# Patient Record
Sex: Female | Born: 1970 | Race: Black or African American | Hispanic: No | Marital: Married | Smoking: Never smoker
Health system: Southern US, Community
[De-identification: ages and names within clinical notes are randomized; demographics above are authoritative.]

## PROBLEM LIST (undated history)

## (undated) DIAGNOSIS — I10 Essential (primary) hypertension: Secondary | ICD-10-CM

## (undated) DIAGNOSIS — D649 Anemia, unspecified: Secondary | ICD-10-CM

## (undated) HISTORY — DX: Anemia, unspecified: D64.9

## (undated) HISTORY — DX: Essential (primary) hypertension: I10

---

## 1999-04-15 ENCOUNTER — Other Ambulatory Visit: Admission: RE | Admit: 1999-04-15 | Discharge: 1999-04-15 | Payer: Self-pay | Admitting: Internal Medicine

## 2000-04-06 ENCOUNTER — Other Ambulatory Visit: Admission: RE | Admit: 2000-04-06 | Discharge: 2000-04-06 | Payer: Self-pay | Admitting: Internal Medicine

## 2001-12-05 ENCOUNTER — Emergency Department (HOSPITAL_COMMUNITY): Admission: EM | Admit: 2001-12-05 | Discharge: 2001-12-05 | Payer: Self-pay | Admitting: Emergency Medicine

## 2001-12-05 ENCOUNTER — Encounter: Payer: Self-pay | Admitting: Emergency Medicine

## 2001-12-07 ENCOUNTER — Emergency Department (HOSPITAL_COMMUNITY): Admission: EM | Admit: 2001-12-07 | Discharge: 2001-12-07 | Payer: Self-pay | Admitting: Emergency Medicine

## 2001-12-07 ENCOUNTER — Encounter: Payer: Self-pay | Admitting: Emergency Medicine

## 2002-05-06 ENCOUNTER — Other Ambulatory Visit: Admission: RE | Admit: 2002-05-06 | Discharge: 2002-05-06 | Payer: Self-pay | Admitting: Obstetrics and Gynecology

## 2002-09-19 ENCOUNTER — Inpatient Hospital Stay (HOSPITAL_COMMUNITY): Admission: AD | Admit: 2002-09-19 | Discharge: 2002-09-19 | Payer: Self-pay | Admitting: Gynecology

## 2002-10-03 ENCOUNTER — Inpatient Hospital Stay (HOSPITAL_COMMUNITY): Admission: AD | Admit: 2002-10-03 | Discharge: 2002-10-03 | Payer: Self-pay | Admitting: Obstetrics and Gynecology

## 2002-11-10 ENCOUNTER — Inpatient Hospital Stay (HOSPITAL_COMMUNITY): Admission: AD | Admit: 2002-11-10 | Discharge: 2002-11-12 | Payer: Self-pay | Admitting: Obstetrics and Gynecology

## 2003-05-27 ENCOUNTER — Other Ambulatory Visit: Admission: RE | Admit: 2003-05-27 | Discharge: 2003-05-27 | Payer: Self-pay | Admitting: Obstetrics and Gynecology

## 2004-07-19 ENCOUNTER — Other Ambulatory Visit: Admission: RE | Admit: 2004-07-19 | Discharge: 2004-07-19 | Payer: Self-pay | Admitting: Obstetrics and Gynecology

## 2004-09-30 ENCOUNTER — Emergency Department (HOSPITAL_COMMUNITY): Admission: EM | Admit: 2004-09-30 | Discharge: 2004-09-30 | Payer: Self-pay | Admitting: Emergency Medicine

## 2005-08-16 ENCOUNTER — Other Ambulatory Visit: Admission: RE | Admit: 2005-08-16 | Discharge: 2005-08-16 | Payer: Self-pay | Admitting: Obstetrics and Gynecology

## 2005-09-10 ENCOUNTER — Emergency Department (HOSPITAL_COMMUNITY): Admission: EM | Admit: 2005-09-10 | Discharge: 2005-09-10 | Payer: Self-pay | Admitting: Emergency Medicine

## 2007-08-15 HISTORY — PX: TUBAL LIGATION: SHX77

## 2007-09-17 ENCOUNTER — Other Ambulatory Visit: Admission: RE | Admit: 2007-09-17 | Discharge: 2007-09-17 | Payer: Self-pay | Admitting: Family Medicine

## 2007-12-15 ENCOUNTER — Inpatient Hospital Stay (HOSPITAL_COMMUNITY): Admission: AD | Admit: 2007-12-15 | Discharge: 2007-12-15 | Payer: Self-pay | Admitting: Obstetrics and Gynecology

## 2008-05-27 ENCOUNTER — Inpatient Hospital Stay (HOSPITAL_COMMUNITY): Admission: AD | Admit: 2008-05-27 | Discharge: 2008-05-31 | Payer: Self-pay | Admitting: Obstetrics and Gynecology

## 2008-05-30 ENCOUNTER — Encounter (INDEPENDENT_AMBULATORY_CARE_PROVIDER_SITE_OTHER): Payer: Self-pay | Admitting: Obstetrics and Gynecology

## 2009-01-12 ENCOUNTER — Emergency Department (HOSPITAL_COMMUNITY): Admission: EM | Admit: 2009-01-12 | Discharge: 2009-01-12 | Payer: Self-pay | Admitting: Family Medicine

## 2009-10-06 ENCOUNTER — Encounter: Admission: RE | Admit: 2009-10-06 | Discharge: 2009-10-06 | Payer: Self-pay | Admitting: Emergency Medicine

## 2009-11-01 ENCOUNTER — Other Ambulatory Visit: Admission: RE | Admit: 2009-11-01 | Discharge: 2009-11-01 | Payer: Self-pay | Admitting: Family Medicine

## 2009-12-12 ENCOUNTER — Emergency Department (HOSPITAL_COMMUNITY): Admission: EM | Admit: 2009-12-12 | Discharge: 2009-12-12 | Payer: Self-pay | Admitting: Emergency Medicine

## 2010-12-27 NOTE — Op Note (Signed)
[name], [name]                ACCOUNT NO.:  [id number]   MEDICAL RECORD NO.:  [id number]          PATIENT TYPE:  INP   LOCATION:  9101                          FACILITY:  [facility]   PHYSICIAN:  Hal Morales, M.D.DATE OF BIRTH:  Oct 17, 1970   DATE OF PROCEDURE:  05/30/2008  DATE OF DISCHARGE:                               OPERATIVE REPORT   PREOPERATIVE DIAGNOSIS:  Desire for surgical sterilization.   POSTOPERATIVE DIAGNOSIS:  Desire for surgical sterilization.   OPERATION:  Postpartum tubal ligation.   ANESTHESIA:  Epidural.   ESTIMATED BLOOD LOSS:  Less than 10 mL.   COMPLICATIONS:  None.   FINDINGS:  The tubes appeared normal for the postpartum state.   PROCEDURE:  The patient was taken to the operating room after  appropriate identification and placed on the operating table.  Her labor  epidural was dosed for surgical anesthesia.  The abdomen was prepped  with multiple layers of Betadine and draped as a sterile field.  After  the assurance of adequate anesthesia, a subumbilical injection of 10 mL  of 0.25%  Marcaine was undertaken.  A subumbilical incision was made and  the abdomen opened in layers.  The peritoneum was entered.  The left  fallopian tube was identified, followed to its fimbriated end, then  grasped at the isthmic portion and elevated.  A suture of 2-0 chromic  was placed through the mesosalpinx and tied __________on a knuckle of  tube.  A second ligature was placed proximal to that and the intervening  knuckle of tube was excised.  The cut ends were cauterized.  A similar  procedure was carried out on the opposite side.  Hemostasis was noted to  be adequate.  The abdominal peritoneum was closed in a pursestring  fashion with a suture of 0 Vicryl.  The fascia was closed in a running  fashion with a suture of 0 Vicryl.  The skin incision was closed with a  subcuticular suture of 3-0 Vicryl.  A sterile dressing was applied.  The  patient was taken from the  operating room to the recovery room in  satisfactory condition having tolerated the procedure well with sponge  and instrument counts correct.   SPECIMENS TO PATHOLOGY:  Portions of right and left fallopian tube.      Hal Morales, M.D.  Electronically Signed     VPH/MEDQ  D:  05/30/2008  T:  05/30/2008  Job:  [id number]

## 2010-12-27 NOTE — Discharge Summary (Signed)
NAMEKELLINA, DREESE                ACCOUNT NO.:  [id number]   MEDICAL RECORD NO.:  [id number]          PATIENT TYPE:  INP   LOCATION:  9101                          [facility]ACILITY:  [facility]   PHYSICIAN:  Hal Morales, M.D.DATE OF BIRTH:  Oct 20, 1970   DATE OF ADMISSION:  05/27/2008  DATE OF DISCHARGE:  05/31/2008                               DISCHARGE SUMMARY   ADMITTING DIAGNOSES:  1. Intrauterine pregnancy at 39-1/7 weeks.  2. Pregnancy-induced hypertension versus preeclampsia.  3. Thrombocytopenia   DISCHARGE DIAGNOSES:  1. Stable status post a spontaneous vaginal delivery with findings of      a viable female infant born May 29, 2006, 17:18 p.m., weighing 7      pounds 3 ounces (3270 grams).  Apgar of  6 at 1 minute and 9 at 5      minutes.  Born at 39-3/7 weeks' gestation, who had a tight shoulder      nuchal as well as status post a postpartum BTL.  2. Gestational hypertension with continued blood pressure management      postpartum with onset of hypertension 38th week.  3. Anemia without hemodynamic instability with also history of anemia      prior to pregnancy.  4. Formula feeding.  5. Unresolved thrombocytopenia at discharge.   HOSPITAL PROCEDURES:  1. Epidural anesthesia.  2. Postpartum bilateral tubal ligation.   HOSPITAL COURSE:  [name] is a 40 year old black female gravida 4,  para 2-0-1-2 who presented on the day of admission at 39-1/7 weeks for  induction of labor secondary to gestational hypertension with also low  platelets.  She was seen in the office that day, had been there 2 days  prior, had some elevated blood pressures.  PIH labs were drawn.  The  patient returned for a check and platelets were noted to have been 104  two days prior.  Consulted with Dr. Pennie Rushing and the patient was given  option of:  1. Induction of labor secondary to term and gestational hypertension      or,  2. Begin 24-hour urine as well as blood pressure medication and  continue management and waiting for induction or spontaneous labor.      The patient did elect for induction of labor.  Her pregnancy had      been followed by nurse midwife service at [facility].  History had been      remarkable for:  3. Advanced maternal age.  4. History of anemia.  5. History of PIH with previous pregnancy and subsequent induction.  6. Migraines.  7. History of preterm labor.  8. Elevated BMI.  9. Group beta strep negative.   On admission patient's blood pressures were 175/106, 166/93 and 162/96.  Heart rate was reactive and reassuring with some irregular contractions.  The patient's cervix was 1 cm, 40% effaced and -2 station with  ballotable vertex.  She did have some anemia.  She had PIH labs redrawn  on admission and her white count was 10.6, hemoglobin was 10.3,  hematocrit 32 and platelets were 108.  Her CMET showed potassium was  slightly low equal to 3.2.  Glucose was high at 139.  To note, her liver  function tests were within normal limits.  SGOT was 23, SGPT was 17.  LDH was within normal limits equal to 141 and her uric acid was 4.1.  She had an original urinalysis that did have 30 of protein and it also  had 100 of glucose.  Approximately 2-3 hours later, another one was sent  and it was negative for protein.  The patient was admitted with Dr.  Pennie Rushing attending with routine orders and low dose Pitocin was started.  The urine that was sent was a cath.  The original one had been a clean-  catch.  The patient did receive two doses of labetalol IV and subsequent  to that, blood pressures were systolics 130s-140s and diastolics were in  the 90s.  Fetal heart rate was reassuring.  She was having contractions  every 3-4 minutes and Pitocin was at 12 milliunits at 1:15 a.m. and plan  was made to remain at that level during the night.  At 5:25 a.m. on the  15th, the patient was complaining of some pain with her contractions.  Fetal heart rate was reassuring.   Contractions were every 4 minutes and  plan was made to continue to observe.  At 8:00 a.m. the patient was  comfortable, her uterine contractions were every 2-5 minutes lasting  about a minute.  They were mild to moderate on palpation.  Pitocin was  at 16  milliunits.  She had a negative CST and reactive tracing.  Cervix  showed external os 1 cm,  internal os closed,  40% vertex -2.  Blood  pressure 150/100.  The patient received 20 mg of labetalol at 5:56 a.m.  She had also received 10 mg of labetalol at 5:38 a.m.  She denied any  PIH signs or symptoms and Pitocin was continued.  At 11:10 a.m. the  patient was still comfortable.  She was allowed to shower.  He blood  pressure was 160/100 before the shower and after was 150/90.  Fetal  heart rate was reactive without D cells and contractions continued every  4 minutes.  Pitocin was at 22 milliunits.  At 3:30 p.m. she was feeling  some contractions.  Pitocin was at 26 milliunits.  Fetal heart rate was  reactive.  Uterine contractions every 3 minutes.  Blood pressure 164-  183/90-110 since 12:00 noon, and she had received a total of 30 mg of  labetalol.  Blood pressure was still 183/103 after her labetalol and  Nigel Bridgeman, certified nurse midwife on call consulted with Dr.  Stefano Gaul.  He recommended 40 mg of IV labetalol that was given at 2:45  p.m. and the blood pressure following was 155/86.  Plan was made to give  Apresoline 5 mg IV if any more medication was necessary for blood  pressure control, and plan was made to recheck the patient's cervix at  5:00 p.m. and if no cervical change, plan was to stop Pitocin and insert  a Foley bulb.  The patient was agreeable with that plan.  At 5:40 p.m.  cervix was still 1 cm, 40% -3 vertex and without change.  Blood pressure  was 169/108.  She received Apresoline 5 mg IV x1 and repeat blood  pressure was 156/91.  Uterine contractions were every 3 minutes.  They  were mild to moderate and Pitocin  reached a max of 26 milliunits.  Pitocin was discontinued and the patient  was allowed to rest and shower  and have supper.  At 22:08 Foley bulb was inserted using a speculum and  ring forceps and the bulb was inflated to 60 mL.  She was without  complain.  No complaints.  No PIH signs or symptoms.  She was having  rare contractions after Pitocin discontinued.  Fetal heart rate 135-140  and reactive, occasional brief mild variable.  Her blood pressures at  19:46 were 157/97.  At 8:45 p.m. it was 156/88 and at 9:25 p.m. it was  145/85.  She was otherwise afebrile and her other vital signs were  stable.  Her cervix remained unchanged and plan was made to rest during  night with Foley in place, continue to observe blood pressures closely  and to receive Ambien to assist with some rest.  By morning of October  16, the patient noted that she had gotten some sleep.  She continued to  deny headache, visual changes or epigastric pain.  Was aware of some  moderate contractions.  They had ranged every 8-10 minutes through the  night.  Blood pressure range during the night was 135-164/80-111.  Blood  pressure the morning of the 16th was 159/82 and a subsequent one was  167/88.  Fetal heart rate was reactive.  No D cells, Foley bulb was  still in place.  The patient was allowed to eat light breakfast and  shower, and then Pitocin was to be started.  Dr. Pennie Rushing was consulted  and labetalol 20 mg p.o. was started every 6 hours.  She was to receive  Apresoline 5 mg p.r.n. IV for blood pressures, systolics over 160 and  diastolics above 105.  The patient was agreeable with plan.  She did  have a CBC that was checked the morning of the 16th and her white count  was 11.8, hemoglobin was down to 9.9, hematocrit 30.3 and her platelets  had gone down to 104 from 108.  The patient at 1:45 p.m. was requesting  pain medication.  Her Pitocin  was on 8 milliunits.  The patient's blood  pressure was 172/104.  She  received a dose of Apresoline and then  following the blood pressure was 157/94.  Cervix was 6 cm, 80% -2 with  bulging membranes and plan was made to allow the patient to receive an  epidural and then artificial rupture of membranes.  Fetal heart rate at  the time was reactive and without D cells.  At around 3:20 p.m. the  patient was comfortable after epidural.  Pitocin  was on 10 milliunits.  Blood pressures had decreased significantly after that.  They were  running in low or upper 1-teens, mainly 120s.  Maximal systolic was 137  and a diastolics were running mid 60s to 70s.  She had a series of late  decelerations with decreased blood pressure and probable association was  to following the epidural, the decrease in the blood pressure.  They did  resolve after oxygen and position change.  Artificial rupture of  membranes was performed by Nigel Bridgeman for clear fluid.  Cervix  remained 6-7 cm, 80% and -2.  IU PC and fetal scalp electrode was placed  and the patient was put on left side.  Plan was made to hydrate and  continue to observe and continue oxygen.  NBUs were around 150.  At 3:25  p.m. the patient complained of some slight pressure.  She had two mild  variables but Pitocin was on 12 milliunits.  Her cervix was 7, 80% and -  1.  Cervix was well applied, slightly asynclitic.  Blood pressure was  135/85, 141/86.  The patient's blood pressure medicine had been changed  to Procardia, but plan was made to hold at present.  The patient's labor  continued to progress to a spontaneous vaginal delivery at 5:18 p.m.  May 29, 2008 after approximately three pushes.  A viable female infant  who did have a tight cord around shoulders but was able to deliver  through.  The baby named Tyson Alias and he weighed 7 pounds 3 ounces,  which was prepped ceased 30-70 grams, 20 inches in length.  Apgars were  5 at 1 minute and 9 at 5  minutes.  The patient had an intact perineum.  No lacerations  were noted.  Blood pressure just after delivery was  167/95.  Dr. Pennie Rushing was consulted and the patient was given 30 mg of  Procardia at that time and would start 60 mg of Procardia XL on a.m.  postpartum day #1.  Continue close monitoring as well as daily weights.  Infant went to full-term nursery and mom to recovery in good condition.  The patient did verbalize desire for a postpartum BTL.  Risks, benefits  and alternatives were discussed by Dr. Pennie Rushing.  The patient did  verbalize consent and desire to proceed.   On May 30, 2008 the patient was without complaints.  She was  afebrile.  Blood pressure was 150/104.  Her lungs were clear.  Abdomen  soft.  Fundus was firm at umbilicus.  She had scant lochia and was  n.p.o. for her tubal, which was done morning of May 30, 2008.  Normal tubes and ovaries were noted.  There were no complications.  She  had minimal blood loss.  On the morning of October 17 her white count  was 14, hemoglobin had dropped to 8.8, hematocrit was 27.2 and platelets  were down to 98.  By postpartum day #2 following her vaginal delivery  and postpartum BTL, she was doing well.  She was ready for discharge.  She was formula feeding.  She continued to deny any headaches, visual  disturbances, epigastric pain, dizziness or syncope.  Her pain had been  well controlled on Motrin and p.r.n. Percocet.  She had positive  flatulence.  Lochia was small.  Her vital signs on that morning were 98  degrees, heart rate 88, blood pressure 121/88 and respirations were 20.  Her weight on morning of discharge, May 31, 2008, was 90.3 kg.  The  day before she was 92.8 kg.  She had had significant amount of diuresis  following her procedure.  BP range for the last 24 hours prior to  discharge:  Systolics ranged 121-166 and diastolics ranged 77-109.  Blood pressures did improve as the day progressed on the 17th and she  remained on the Procardia XL 60 mg daily.  Physical exam  was within  normal limits.  Incision periumbilical was clean, dry and intact.  Fundus was firm and below umbilicus.  She had small rubra lochia.  Extremities were 1+ to 2+ pitting edema and negative Homan's.  The  patient was deemed to have received full benefit of her hospital stay  and was discharged home in stable condition.   DISCHARGE INSTRUCTIONS:  Were per [facility] pamphlet.  She was also given  preeclampsia precautions.  Plan is for [facility] to do a blood  pressure check at first of the  week.  She is also to follow up at [facility]  at 6 weeks or p.r.n.  she was given prescriptions for discharge  medications.  1. Motrin 600 mg p.o. every 6 hours p.r.n. pain.  2. Percocet 5/325 one to two tablets p.o. every 4 to 6 hours p.r.n.      moderate to severe pain.  3. [facility] prenatal vitamin 1 tablet p.o. daily for iron      supplementation.  4. Colace 1 tablet p.o. q.a.m., a repeat dose in p.m. to prevent      constipation.  5. She is also to continue Procardia XL 60 mg p.o. daily.      [place], [place]      Hal Morales, M.D.  Electronically Signed    CHS/MEDQ  D:  05/31/2008  T:  05/31/2008  Job:  [id number]

## 2010-12-27 NOTE — H&P (Signed)
[name], [name]                ACCOUNT NO.:  [id number]   MEDICAL RECORD NO.:  [id number]          PATIENT TYPE:  INP   LOCATION:  9175                          FACILITY:  [facility]   PHYSICIAN:  Hal Morales, M.D.DATE OF BIRTH:  1971-05-26   DATE OF ADMISSION:  05/27/2008  DATE OF DISCHARGE:                              HISTORY & PHYSICAL   HISTORY OF PRESENT ILLNESS:  This is a 40 year old gravida 4, para 2-0-1-  2 at 39-1/7th weeks, who presents for induction of labor secondary to  [facility] with low platelets.  Platelets were 104,000 at the last check.  Pregnancy has been followed by the Nurse-Midwife Service and remarkable  for;  1. AMA.  2. History of anemia.  3. History of PIH.  4. Migraines.  5. History of preterm labor.  6. Elevated BMI.  7. Group B Strep negative.   ALLERGIES:  None.   OB HISTORY:  Remarkable for a vaginal delivery in 1996 of a female infant  at 78 weeks' gestation weighing 6 pounds and remarkable for preterm  labor.  She had a vaginal delivery in 2004 of a female infant at 27  weeks' gestation weighing 6 pounds and remarkable for high blood  pressure.  She had an elective AB in 1996.   MEDICAL HISTORY:  Remarkable for anemia, pregnancy-induced hypertension,  and preterm labor.  She has a history of childhood varicella and  migraines.   SURGICAL HISTORY:  Remarkable for wisdom teeth in 1995 and EAB in 1996.   FAMILY HISTORY:  Remarkable for grandfather with MI, mother and  grandmother with hypertension and with diabetes.   GENETIC HISTORY:  Remarkable for father of the baby's aunt with mental  retardation and the patient's mother with spinal defect.   SOCIAL HISTORY:  The patient is single.  Father of baby, Larey Brick,  is involved and supportive.  She does not refer to religious  affiliation.  She denies any tobacco, tobacco, or drug use.   PRENATAL LABS:  Hemoglobin 10, platelets 300, blood type A positive,  antibody screen negative, sickle  cell negative, RPR nonreactive, rubella  immune, hepatitis negative, HIV negative, Pap test normal, gonorrhea  negative, chlamydia negative, cystic fibrosis negative.   HISTORY OF CURRENT PREGNANCY:  The patient entered our care at 10 weeks'  gestation.  She had a first-trimester screen that was normal.  She  declined amniocentesis.  Ultrasound was done for dates and it was  normal.  An abdominal ultrasound was also normal.  Glucola at 22 weeks  was 114 and her ultrasound at 34 weeks showed a normal AFI and normal  growth, and she was group B strep negative at term.   OBJECTIVE DATA:  VITAL SIGNS:  Stable.  Blood pressures are 175/106,  166/93, and 162/96.  Her fetal heart rate is reactive with irregular  contractions.  HEENT:  Within normal limits. Thyroid normal and not enlarged.  CHEST:  Clear to auscultation.  HEART:  Regular rate and rhythm.  ABDOMEN:  Gravid, __________.  PELVIC:  Cervix is 1 cm, 40% effaced, and -2 station with ballotable  vertex.  EXTREMITIES:  Show 1+ edema.   PIH labs are pending.   ASSESSMENT:  1. Intrauterine pregnancy at 39-1/7th weeks.  2. Pregnancy-induced hypertension versus preeclampsia.  3. Thrombocytopenia.   PLAN:  1. Admit for Dr. Pennie Rushing.  2. Routine CNM orders.  3. Pitocin per low-dose protocol.      Marie L. Williams, C.N.M.      Hal Morales, M.D.  Electronically Signed    MLW/MEDQ  D:  05/27/2008  T:  05/28/2008  Job:  [id number]

## 2010-12-30 NOTE — H&P (Signed)
NAMEVICTORIA, [name]                            ACCOUNT NO.:  [id number]   MEDICAL RECORD NO.:  [id number]                   PATIENT TYPE:  INP   LOCATION:  9162                                 [facility]ACILITY:  [facility]   PHYSICIAN:  Osborn Coho, M.D.                DATE OF BIRTH:  [birth year]   DATE OF ADMISSION:  11/10/2002  DATE OF DISCHARGE:                                HISTORY & PHYSICAL   HISTORY:  Ms. [name] is 40 year old gravida 3, para 1, 0, 1, 1, EDD 11/13/02 by  first trimester ultrasound and confirmed with follow up.  She presents with  increasing labor symptoms, contractions increasing in frequency and  intensity.  Positive fetal movement, no bleeding, no rupture of membranes.  She denies any headache, visual changes or epigastric pain.  Her pregnancy  has been followed by the CNM service at [facility] and is remarkable for:  1. Desires sterilization.  2. Group B strep negative.  This patient was initially evaluated at the office of [facility] at [redacted] weeks  gestation, [facility] determined by first trimester ultrasound and confirmed with  follow up.  Prenatal lab work on 05/06/02: hemoglobin and hematocrit 10.2 and  32.2, platelets 197,000.  Blood type and Rh A positive, antibody screen  negative.  Sickle cell trait negative.  VDRL nonreactive.  Rubella immune.  Hepatitis B surface antigen negative.  HIV nonreactive.  Pap smear within  normal limits.  GC and Chlamydia negative.  CF testing negative.  AFP/free  beta HCG within normal range at 28 weeks, one hour glucose challenge 123 and  hemoglobin 11.1 at 36 weeks.  Culture of the vaginal tract is negative for  group B strep and GC and Chlamydia.  This patient's Prenancy has been  essentially uncomplicated.  She has been size equal to dates throughout,  normotensive with no proteinuria.  On admission to [facility] she has experienced  increased blood pressures which are now 140 to 153 over 92 to 110 with no  proteinuria.   OB HISTORY:  In 1996 patient had  a normal spontaneous vaginal delivery with  the birth of a 6 pound, 11 ounce female infant at term with no complications.  She did note that her blood pressure was up during labor.  In 1996 she had  an elective AB with no complications, and the present pregnancy.   PAST MEDICAL HISTORY:  She has a history of migraine headaches.  She does  have a history of anemia, otherwise unremarkable.   SURGICAL HISTORY:  Wisdom teeth, 1994.   FAMILY HISTORY:  Maternal grandfather with a history of myocardial  infarction.  The patient's mother and maternal grandmother with a history of  chronic hypertension.  Maternal aunts with diabetes.  Maternal grandfather -  stroke.   GENETIC HISTORY:  A nephew of the baby's father has questionable Down  syndrome.   SOCIAL HISTORY:  Ms. [name] is  a 40 year old, single African-American female.  Her partner (father of the baby), [name] is involved and supportive.  They are nondemoninational in their faith.  The patient has No known drug allergies.  Denies the use of tobacco, alcohol  or illicit drugs.   REVIEW OF SYSTEMS:  Are as described above.  The patient is typical of one  with a uterine pregnancy at term in early labor with increased blood  pressure.   PHYSICAL EXAMINATION:  VITAL SIGNS: Blood pressures have been 147/100,  140/110, 153/107, 150/89, 140/92.  Pulse 80, respirations 20, she is  afebrile.  HEENT: Unremarkable.  HEART: Regular rate and rhythm.  LUNGS: Clear.  ABDOMEN: Gravid in its contour, uterine fundus is noted to extend 39 cm  above the level of the pubic symphysis.  Leopold maneuver finds the infant  to be in a longitudinal lie, cephalic  presentation and the estimated fetal weight is 7 to 7-1/2 pounds.  The  baseline of the fetal heart rate is 140's, positive variability, positive  accelerations although it is not reactive there are occasional mild variable  decelerations.  Spontaneous negative CST.  The patient is contracting  every  three to five minutes.  CERVICAL EXAM: Her cervix initially on exam was one to two cm, 50% effaced,  with the vertex at a -2 station, she is now 3 cm and 70% effaced.   PIH LABS:  Find CBC with WBC 13.7 thousand, hemoglobin and hematocrit 11.0  and 34.9, platelets 139,000.  Comprehensive metabolic panel finds SGOT 13,  SGPT less than 19, alkaline phosphatase 111, LDH 134, uric acid 4.0.  Clean  catheter urine is negative for protein, 15 mg/dl of ketones, small amount of  heme and small leukocytes esterase.  EXTREMITIES: Show 1+ edema, DTR's are 1+ with no clonus.   ASSESSMENT:  1. Intrauterine pregnancy at term, early labor.  2. Pregnancy induced hypertension.   PLAN:  Admit or Dr. Osborn Coho.  Repeat PIH labs, MUA in six hours.  The  patient may have epidural.      Rica Koyanagi, C.N.M.               Osborn Coho, M.D.    SDM/MEDQ  D:  11/10/2002  T:  11/10/2002  Job:  [id number]

## 2011-05-10 LAB — URINE MICROSCOPIC-ADD ON

## 2011-05-10 LAB — URINALYSIS, ROUTINE W REFLEX MICROSCOPIC
Bilirubin Urine: NEGATIVE
Ketones, ur: NEGATIVE
Specific Gravity, Urine: 1.025
Urobilinogen, UA: 0.2

## 2011-05-15 LAB — URINALYSIS, ROUTINE W REFLEX MICROSCOPIC
Bilirubin Urine: NEGATIVE
Bilirubin Urine: NEGATIVE
Glucose, UA: 100 — AB
Glucose, UA: NEGATIVE
Hgb urine dipstick: NEGATIVE
Hgb urine dipstick: NEGATIVE
Ketones, ur: NEGATIVE
Nitrite: NEGATIVE
Specific Gravity, Urine: 1.025
pH: 5.5
pH: 6

## 2011-05-15 LAB — CBC
Hemoglobin: 10.3 — ABNORMAL LOW
Hemoglobin: 9.9 — ABNORMAL LOW
MCHC: 32.4
MCHC: 32.6
MCV: 69.2 — ABNORMAL LOW
MCV: 77 — ABNORMAL LOW
MCV: 78.5
RBC: 3.46 — ABNORMAL LOW
RBC: 3.94
RBC: 4.62
WBC: 10.6 — ABNORMAL HIGH

## 2011-05-15 LAB — COMPREHENSIVE METABOLIC PANEL
ALT: 17
AST: 23
CO2: 20
Chloride: 110
GFR calc Af Amer: >60
GFR calc non Af Amer: >60
Glucose, Bld: 139 — ABNORMAL HIGH
Sodium: 136
Total Bilirubin: 0.3

## 2012-12-11 ENCOUNTER — Other Ambulatory Visit (HOSPITAL_COMMUNITY)
Admission: RE | Admit: 2012-12-11 | Discharge: 2012-12-11 | Disposition: A | Discharge status: Home / Self Care | Payer: Self-pay | Source: Ambulatory Visit | Attending: Family Medicine | Admitting: Family Medicine

## 2012-12-11 ENCOUNTER — Other Ambulatory Visit: Payer: Self-pay | Admitting: Family Medicine

## 2012-12-11 DIAGNOSIS — Z124 Encounter for screening for malignant neoplasm of cervix: Secondary | ICD-10-CM | POA: Insufficient documentation

## 2015-07-02 ENCOUNTER — Emergency Department (HOSPITAL_COMMUNITY)
Admission: EM | Admit: 2015-07-02 | Discharge: 2015-07-02 | Disposition: A | Discharge status: Home / Self Care | Payer: BLUE CROSS/BLUE SHIELD | Source: Home / Self Care | Attending: Family Medicine | Admitting: Family Medicine

## 2015-07-02 DIAGNOSIS — M778 Other enthesopathies, not elsewhere classified: Secondary | ICD-10-CM | POA: Diagnosis not present

## 2015-07-02 MED ORDER — DICLOFENAC SODIUM 1 % TD GEL: 1.0000 "application " | Freq: Four times a day (QID) | TRANSDERMAL | Status: DC

## 2015-07-02 NOTE — ED Notes (Signed)
[facility] a [facility] of left arm pain and numbness that started earlier this week.

## 2015-07-02 NOTE — ED Provider Notes (Signed)
CSN: [id number]     Arrival date & time 07/02/15  1859 History   First MD Initiated Contact with Patient 07/02/15 1939     Chief Complaint  Patient presents with  . Numbness  . Arm Pain   (Consider location/radiation/quality/duration/timing/severity/associated sxs/prior Treatment) HPI Comments: 44 year old female complaining of numbness in the left arm for approximately one week. It seems to get a little worse after work. 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 The pain is primarily to the dorsum of the left wrist and the forearm. There is also pain that tends to shoot around the deltoid and the triceps. No known injury. No trauma. No numbness in other areas. Denies other neurologic symptoms.   No past medical history on file. No past surgical history on file. No family history on file. Social History  Substance Use Topics  . Smoking status: Not on file  . Smokeless tobacco: Not on file  . Alcohol Use: Not on file   OB History    No data available     Review of Systems  Constitutional: Negative.   HENT: Negative.   Respiratory: Negative.   Cardiovascular: Negative.   Genitourinary: Negative.   Musculoskeletal: Negative for back pain.  Skin: Negative.   Neurological: Negative.  Negative for dizziness, tremors, seizures, syncope, facial asymmetry, speech difficulty, weakness, light-headedness and headaches.  Psychiatric/Behavioral: Negative.     Allergies  Review of patient's allergies indicates no known allergies.  Home Medications   Prior to Admission medications   Not on File   Meds Ordered and Administered this Visit  Medications - No data to display  BP 174/101 mmHg  Pulse 77  Temp(Src) 98.2 F (36.8 C) (Oral)  Resp 20  SpO2 96%  LMP 05/28/2015 (Approximate) No data found.   Physical Exam  Constitutional: She is oriented to person, place, and time. She appears well-developed and well-nourished. No distress.  HENT:  Head:  Normocephalic and atraumatic.  Eyes: EOM are normal.  Neck: Normal range of motion. Neck supple.  Musculoskeletal:  There is tenderness to the extensor surface of the left wrist as well as the forearm. Having the patient extend the wrist against resistance increases pain in the wrist and lesser to the forearm. Minor tenderness to deep palpation of the outer aspect of the upper arm. Distal neurovascular motor sensory is intact. Red pulse is 2+. Full range of motion of the wrist, elbow and shoulder.  Neurological: She is alert and oriented to person, place, and time. No cranial nerve deficit.  Skin: Skin is warm and dry.  Nursing note and vitals reviewed.   ED Course  Procedures (including critical care time)  Labs Review Labs Reviewed - No data to display  Imaging Review No results found.  ED ECG REPORT   Date: 07/03/2015  Rate: 70  Rhythm: normal sinus rhythm  QRS Axis: normal  Intervals: normal  ST/T Wave abnormalities: T wave inversion III. Small Q in aVF. No S-T changes suggestive of ischemia or infarct.  Conduction Disutrbances:none  Narrative Interpretation:   Old EKG Reviewed: none available  I have personally reviewed the EKG tracing and agree with the computerized printout as noted. No sufficient evidence for anterior infarct.  Visual Acuity Review  Right Eye Distance:   Left Eye Distance:   Bilateral Distance:    Right Eye Near:   Left Eye Near:    Bilateral Near:         MDM   1. 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 These areas are consistent with pain associated with keyboarding that requires prolonged use of wrist, digits, shoulder and upper arm and associated muscle contractions that become inflamed. Ice locally. Diclofenac gel Ibuprofen or Aleve when necessary Wrist splint.    Hayden Rasmussen, NP 07/02/15 2033  Hayden Rasmussen, NP 07/03/15 [phone]

## 2016-03-09 ENCOUNTER — Other Ambulatory Visit: Payer: Self-pay | Admitting: Family Medicine

## 2016-03-09 ENCOUNTER — Other Ambulatory Visit (HOSPITAL_COMMUNITY)
Admission: RE | Admit: 2016-03-09 | Discharge: 2016-03-09 | Disposition: A | Discharge status: Home / Self Care | Payer: BLUE CROSS/BLUE SHIELD | Source: Ambulatory Visit | Attending: Family Medicine | Admitting: Family Medicine

## 2016-03-09 DIAGNOSIS — N760 Acute vaginitis: Secondary | ICD-10-CM | POA: Diagnosis not present

## 2016-03-09 DIAGNOSIS — Z13220 Encounter for screening for lipoid disorders: Secondary | ICD-10-CM | POA: Diagnosis not present

## 2016-03-09 DIAGNOSIS — Z01419 Encounter for gynecological examination (general) (routine) without abnormal findings: Secondary | ICD-10-CM | POA: Diagnosis not present

## 2016-03-09 DIAGNOSIS — Z113 Encounter for screening for infections with a predominantly sexual mode of transmission: Secondary | ICD-10-CM | POA: Diagnosis not present

## 2016-03-09 DIAGNOSIS — Z124 Encounter for screening for malignant neoplasm of cervix: Secondary | ICD-10-CM | POA: Diagnosis not present

## 2016-03-09 DIAGNOSIS — Z0000 Encounter for general adult medical examination without abnormal findings: Secondary | ICD-10-CM | POA: Diagnosis not present

## 2016-03-10 LAB — CYTOLOGY - PAP

## 2016-05-11 DIAGNOSIS — D649 Anemia, unspecified: Secondary | ICD-10-CM | POA: Diagnosis not present

## 2016-05-25 DIAGNOSIS — N390 Urinary tract infection, site not specified: Secondary | ICD-10-CM | POA: Diagnosis not present

## 2016-08-14 HISTORY — PX: BREAST BIOPSY: SHX20

## 2017-03-13 DIAGNOSIS — D649 Anemia, unspecified: Secondary | ICD-10-CM | POA: Diagnosis not present

## 2017-03-13 DIAGNOSIS — Z13220 Encounter for screening for lipoid disorders: Secondary | ICD-10-CM | POA: Diagnosis not present

## 2017-03-13 DIAGNOSIS — Z0000 Encounter for general adult medical examination without abnormal findings: Secondary | ICD-10-CM | POA: Diagnosis not present

## 2017-03-27 DIAGNOSIS — R8290 Unspecified abnormal findings in urine: Secondary | ICD-10-CM | POA: Diagnosis not present

## 2017-03-27 DIAGNOSIS — N390 Urinary tract infection, site not specified: Secondary | ICD-10-CM | POA: Diagnosis not present

## 2017-05-28 DIAGNOSIS — H524 Presbyopia: Secondary | ICD-10-CM | POA: Diagnosis not present

## 2017-05-28 DIAGNOSIS — H5213 Myopia, bilateral: Secondary | ICD-10-CM | POA: Diagnosis not present

## 2017-05-28 DIAGNOSIS — H52222 Regular astigmatism, left eye: Secondary | ICD-10-CM | POA: Diagnosis not present

## 2017-07-24 ENCOUNTER — Other Ambulatory Visit: Payer: Self-pay | Admitting: Family Medicine

## 2017-07-24 DIAGNOSIS — N6320 Unspecified lump in the left breast, unspecified quadrant: Secondary | ICD-10-CM

## 2017-07-24 DIAGNOSIS — N6322 Unspecified lump in the left breast, upper inner quadrant: Secondary | ICD-10-CM | POA: Diagnosis not present

## 2017-07-31 ENCOUNTER — Ambulatory Visit
Admission: RE | Admit: 2017-07-31 | Discharge: 2017-07-31 | Disposition: A | Discharge status: Home / Self Care | Payer: BLUE CROSS/BLUE SHIELD | Source: Ambulatory Visit | Attending: Family Medicine | Admitting: Family Medicine

## 2017-07-31 ENCOUNTER — Other Ambulatory Visit: Payer: Self-pay | Admitting: Family Medicine

## 2017-07-31 DIAGNOSIS — N6320 Unspecified lump in the left breast, unspecified quadrant: Secondary | ICD-10-CM

## 2017-07-31 DIAGNOSIS — R922 Inconclusive mammogram: Secondary | ICD-10-CM | POA: Diagnosis not present

## 2017-08-03 ENCOUNTER — Ambulatory Visit
Admission: RE | Admit: 2017-08-03 | Discharge: 2017-08-03 | Disposition: A | Discharge status: Home / Self Care | Payer: BLUE CROSS/BLUE SHIELD | Source: Ambulatory Visit | Attending: Family Medicine | Admitting: Family Medicine

## 2017-08-03 ENCOUNTER — Other Ambulatory Visit: Payer: Self-pay | Admitting: Family Medicine

## 2017-08-03 DIAGNOSIS — N6320 Unspecified lump in the left breast, unspecified quadrant: Secondary | ICD-10-CM

## 2017-08-03 DIAGNOSIS — N62 Hypertrophy of breast: Secondary | ICD-10-CM | POA: Diagnosis not present

## 2017-08-03 DIAGNOSIS — N6322 Unspecified lump in the left breast, upper inner quadrant: Secondary | ICD-10-CM | POA: Diagnosis not present

## 2017-10-18 ENCOUNTER — Other Ambulatory Visit: Payer: Self-pay | Admitting: Family Medicine

## 2017-10-18 DIAGNOSIS — N6320 Unspecified lump in the left breast, unspecified quadrant: Secondary | ICD-10-CM

## 2017-11-02 ENCOUNTER — Ambulatory Visit
Admission: RE | Admit: 2017-11-02 | Discharge: 2017-11-02 | Disposition: A | Discharge status: Home / Self Care | Payer: BLUE CROSS/BLUE SHIELD | Source: Ambulatory Visit | Attending: Family Medicine | Admitting: Family Medicine

## 2017-11-02 DIAGNOSIS — N6322 Unspecified lump in the left breast, upper inner quadrant: Secondary | ICD-10-CM | POA: Diagnosis not present

## 2017-11-02 DIAGNOSIS — N6320 Unspecified lump in the left breast, unspecified quadrant: Secondary | ICD-10-CM

## 2018-01-17 DIAGNOSIS — R1031 Right lower quadrant pain: Secondary | ICD-10-CM | POA: Diagnosis not present

## 2018-01-17 DIAGNOSIS — R1033 Periumbilical pain: Secondary | ICD-10-CM | POA: Diagnosis not present

## 2018-01-17 DIAGNOSIS — R030 Elevated blood-pressure reading, without diagnosis of hypertension: Secondary | ICD-10-CM | POA: Diagnosis not present

## 2018-01-18 ENCOUNTER — Other Ambulatory Visit: Payer: Self-pay | Admitting: Family Medicine

## 2018-01-18 ENCOUNTER — Ambulatory Visit
Admission: RE | Admit: 2018-01-18 | Discharge: 2018-01-18 | Disposition: A | Discharge status: Home / Self Care | Payer: BLUE CROSS/BLUE SHIELD | Source: Ambulatory Visit | Attending: Family Medicine | Admitting: Family Medicine

## 2018-01-18 DIAGNOSIS — D259 Leiomyoma of uterus, unspecified: Secondary | ICD-10-CM | POA: Diagnosis not present

## 2018-01-18 DIAGNOSIS — R1033 Periumbilical pain: Secondary | ICD-10-CM

## 2018-01-18 MED ORDER — IOPAMIDOL (ISOVUE-300) INJECTION 61%
100.0000 mL | Freq: Once | INTRAVENOUS | Status: AC | PRN
  Administered 2018-01-18: 100 mL via INTRAVENOUS

## 2018-01-25 DIAGNOSIS — R1031 Right lower quadrant pain: Secondary | ICD-10-CM | POA: Diagnosis not present

## 2018-05-14 DIAGNOSIS — N3000 Acute cystitis without hematuria: Secondary | ICD-10-CM | POA: Diagnosis not present

## 2018-05-14 DIAGNOSIS — R8290 Unspecified abnormal findings in urine: Secondary | ICD-10-CM | POA: Diagnosis not present

## 2018-05-14 DIAGNOSIS — R399 Unspecified symptoms and signs involving the genitourinary system: Secondary | ICD-10-CM | POA: Diagnosis not present

## 2018-06-24 DIAGNOSIS — M25569 Pain in unspecified knee: Secondary | ICD-10-CM | POA: Diagnosis not present

## 2018-07-31 ENCOUNTER — Other Ambulatory Visit: Payer: Self-pay | Admitting: Family Medicine

## 2018-07-31 DIAGNOSIS — Z1231 Encounter for screening mammogram for malignant neoplasm of breast: Secondary | ICD-10-CM

## 2018-09-02 ENCOUNTER — Ambulatory Visit
Admission: RE | Admit: 2018-09-02 | Discharge: 2018-09-02 | Disposition: A | Discharge status: Home / Self Care | Payer: BLUE CROSS/BLUE SHIELD | Source: Ambulatory Visit | Attending: Family Medicine | Admitting: Family Medicine

## 2018-09-02 DIAGNOSIS — Z1231 Encounter for screening mammogram for malignant neoplasm of breast: Secondary | ICD-10-CM | POA: Diagnosis not present

## 2018-10-29 ENCOUNTER — Other Ambulatory Visit (HOSPITAL_COMMUNITY)
Admission: RE | Admit: 2018-10-29 | Discharge: 2018-10-29 | Disposition: A | Discharge status: Home / Self Care | Payer: BLUE CROSS/BLUE SHIELD | Source: Ambulatory Visit | Attending: Family Medicine | Admitting: Family Medicine

## 2018-10-29 ENCOUNTER — Other Ambulatory Visit: Payer: Self-pay | Admitting: Family Medicine

## 2018-10-29 DIAGNOSIS — E669 Obesity, unspecified: Secondary | ICD-10-CM | POA: Diagnosis not present

## 2018-10-29 DIAGNOSIS — Z6834 Body mass index (BMI) 34.0-34.9, adult: Secondary | ICD-10-CM | POA: Diagnosis not present

## 2018-10-29 DIAGNOSIS — Z0000 Encounter for general adult medical examination without abnormal findings: Secondary | ICD-10-CM | POA: Diagnosis not present

## 2018-10-29 DIAGNOSIS — Z23 Encounter for immunization: Secondary | ICD-10-CM | POA: Diagnosis not present

## 2018-10-29 DIAGNOSIS — Z13220 Encounter for screening for lipoid disorders: Secondary | ICD-10-CM | POA: Diagnosis not present

## 2018-10-29 DIAGNOSIS — Z124 Encounter for screening for malignant neoplasm of cervix: Secondary | ICD-10-CM | POA: Insufficient documentation

## 2018-10-29 DIAGNOSIS — I10 Essential (primary) hypertension: Secondary | ICD-10-CM | POA: Diagnosis not present

## 2018-10-31 LAB — CYTOLOGY - PAP
Diagnosis: NEGATIVE
HPV (WINDOPATH): NOT DETECTED

## 2019-04-10 DIAGNOSIS — I10 Essential (primary) hypertension: Secondary | ICD-10-CM | POA: Diagnosis not present

## 2019-05-22 DIAGNOSIS — I10 Essential (primary) hypertension: Secondary | ICD-10-CM | POA: Diagnosis not present

## 2019-06-03 ENCOUNTER — Other Ambulatory Visit: Payer: Self-pay | Admitting: Family Medicine

## 2019-06-03 DIAGNOSIS — Z1231 Encounter for screening mammogram for malignant neoplasm of breast: Secondary | ICD-10-CM

## 2019-06-19 DIAGNOSIS — M549 Dorsalgia, unspecified: Secondary | ICD-10-CM | POA: Diagnosis not present

## 2019-07-24 DIAGNOSIS — I10 Essential (primary) hypertension: Secondary | ICD-10-CM | POA: Diagnosis not present

## 2019-07-24 DIAGNOSIS — M545 Low back pain: Secondary | ICD-10-CM | POA: Diagnosis not present

## 2019-09-05 ENCOUNTER — Ambulatory Visit: Payer: BLUE CROSS/BLUE SHIELD

## 2019-10-13 ENCOUNTER — Other Ambulatory Visit: Payer: Self-pay

## 2019-10-13 ENCOUNTER — Ambulatory Visit
Admission: RE | Admit: 2019-10-13 | Discharge: 2019-10-13 | Disposition: A | Discharge status: Home / Self Care | Payer: BLUE CROSS/BLUE SHIELD | Source: Ambulatory Visit | Attending: Family Medicine | Admitting: Family Medicine

## 2019-10-13 DIAGNOSIS — Z1231 Encounter for screening mammogram for malignant neoplasm of breast: Secondary | ICD-10-CM

## 2019-10-30 DIAGNOSIS — Z13220 Encounter for screening for lipoid disorders: Secondary | ICD-10-CM | POA: Diagnosis not present

## 2019-10-30 DIAGNOSIS — Z0000 Encounter for general adult medical examination without abnormal findings: Secondary | ICD-10-CM | POA: Diagnosis not present

## 2019-10-30 DIAGNOSIS — I10 Essential (primary) hypertension: Secondary | ICD-10-CM | POA: Diagnosis not present

## 2019-11-06 ENCOUNTER — Ambulatory Visit: Payer: BLUE CROSS/BLUE SHIELD | Attending: Internal Medicine

## 2019-11-06 DIAGNOSIS — Z23 Encounter for immunization: Secondary | ICD-10-CM

## 2019-11-06 NOTE — Progress Notes (Signed)
   Covid-19 Vaccination Clinic  Name:  [name]    MRN: [id number] DOB: 1970-12-08  11/06/2019  Ms. Lakatos was observed post Covid-19 immunization for 15 minutes without incident. She was provided with Vaccine Information Sheet and instruction to access the V-Safe system.   Ms. Mcglade was instructed to call 911 with any severe reactions post vaccine: Marland Kitchen Difficulty breathing  . Swelling of face and throat  . A fast heartbeat  . A bad rash all over body  . Dizziness and weakness   Immunizations Administered    Name Date Dose VIS Date Route   Moderna COVID-19 Vaccine 11/06/2019 10:52 AM 0.5 mL 07/15/2019 Intramuscular   Manufacturer: Moderna   Lot: [id number]   NDC: 43735-789-78

## 2019-12-09 ENCOUNTER — Ambulatory Visit: Payer: BLUE CROSS/BLUE SHIELD | Attending: Internal Medicine

## 2019-12-09 DIAGNOSIS — Z23 Encounter for immunization: Secondary | ICD-10-CM

## 2019-12-09 NOTE — Progress Notes (Signed)
   Covid-19 Vaccination Clinic  Name:  [name]    MRN: [id number] DOB: 1971/02/23  12/09/2019  [name] was observed post Covid-19 immunization for 15 minutes without incident. She was provided with Vaccine Information Sheet and instruction to access the V-Safe system.   [name] was instructed to call 911 with any severe reactions post vaccine: Marland Kitchen Difficulty breathing  . Swelling of face and throat  . A fast heartbeat  . A bad rash all over body  . Dizziness and weakness   Immunizations Administered    Name Date Dose VIS Date Route   Moderna COVID-19 Vaccine 12/09/2019 10:07 AM 0.5 mL 07/2019 Intramuscular   Manufacturer: Moderna   Lot: [id number]   NDC: 93406-840-33

## 2020-02-06 DIAGNOSIS — N390 Urinary tract infection, site not specified: Secondary | ICD-10-CM | POA: Diagnosis not present

## 2020-03-26 DIAGNOSIS — R399 Unspecified symptoms and signs involving the genitourinary system: Secondary | ICD-10-CM | POA: Diagnosis not present

## 2020-06-16 DIAGNOSIS — H40013 Open angle with borderline findings, low risk, bilateral: Secondary | ICD-10-CM | POA: Diagnosis not present

## 2020-07-23 ENCOUNTER — Other Ambulatory Visit: Payer: Self-pay | Admitting: Family Medicine

## 2020-07-23 DIAGNOSIS — Z1231 Encounter for screening mammogram for malignant neoplasm of breast: Secondary | ICD-10-CM

## 2020-08-10 DIAGNOSIS — Z03818 Encounter for observation for suspected exposure to other biological agents ruled out: Secondary | ICD-10-CM | POA: Diagnosis not present

## 2020-10-13 ENCOUNTER — Ambulatory Visit: Payer: BLUE CROSS/BLUE SHIELD

## 2020-11-02 DIAGNOSIS — Z13220 Encounter for screening for lipoid disorders: Secondary | ICD-10-CM | POA: Diagnosis not present

## 2020-11-02 DIAGNOSIS — Z0000 Encounter for general adult medical examination without abnormal findings: Secondary | ICD-10-CM | POA: Diagnosis not present

## 2020-11-02 DIAGNOSIS — I10 Essential (primary) hypertension: Secondary | ICD-10-CM | POA: Diagnosis not present

## 2020-12-02 ENCOUNTER — Inpatient Hospital Stay: Admission: RE | Admit: 2020-12-02 | Payer: Self-pay | Source: Ambulatory Visit

## 2020-12-03 DIAGNOSIS — Z1231 Encounter for screening mammogram for malignant neoplasm of breast: Secondary | ICD-10-CM | POA: Diagnosis not present

## 2020-12-08 DIAGNOSIS — R928 Other abnormal and inconclusive findings on diagnostic imaging of breast: Secondary | ICD-10-CM | POA: Diagnosis not present

## 2020-12-08 DIAGNOSIS — R922 Inconclusive mammogram: Secondary | ICD-10-CM | POA: Diagnosis not present

## 2021-01-19 ENCOUNTER — Ambulatory Visit: Payer: Self-pay

## 2021-01-26 DIAGNOSIS — H40013 Open angle with borderline findings, low risk, bilateral: Secondary | ICD-10-CM | POA: Diagnosis not present

## 2021-02-24 ENCOUNTER — Encounter: Payer: Self-pay | Admitting: Gastroenterology

## 2021-02-24 DIAGNOSIS — M722 Plantar fascial fibromatosis: Secondary | ICD-10-CM | POA: Diagnosis not present

## 2021-02-24 DIAGNOSIS — M7731 Calcaneal spur, right foot: Secondary | ICD-10-CM | POA: Diagnosis not present

## 2021-02-24 DIAGNOSIS — S92015A Nondisplaced fracture of body of left calcaneus, initial encounter for closed fracture: Secondary | ICD-10-CM | POA: Diagnosis not present

## 2021-03-03 DIAGNOSIS — M71571 Other bursitis, not elsewhere classified, right ankle and foot: Secondary | ICD-10-CM | POA: Diagnosis not present

## 2021-03-03 DIAGNOSIS — M722 Plantar fascial fibromatosis: Secondary | ICD-10-CM | POA: Diagnosis not present

## 2021-03-03 DIAGNOSIS — S92015D Nondisplaced fracture of body of left calcaneus, subsequent encounter for fracture with routine healing: Secondary | ICD-10-CM | POA: Diagnosis not present

## 2021-03-03 DIAGNOSIS — M71572 Other bursitis, not elsewhere classified, left ankle and foot: Secondary | ICD-10-CM | POA: Diagnosis not present

## 2021-03-16 DIAGNOSIS — S92015D Nondisplaced fracture of body of left calcaneus, subsequent encounter for fracture with routine healing: Secondary | ICD-10-CM | POA: Diagnosis not present

## 2021-03-24 ENCOUNTER — Other Ambulatory Visit: Payer: Self-pay

## 2021-03-24 ENCOUNTER — Ambulatory Visit (AMBULATORY_SURGERY_CENTER): Payer: BC Managed Care – PPO

## 2021-03-24 VITALS — Ht 63.0 in | Wt 209.0 lb

## 2021-03-24 DIAGNOSIS — Z1211 Encounter for screening for malignant neoplasm of colon: Secondary | ICD-10-CM

## 2021-03-24 MED ORDER — PEG 3350-KCL-NA BICARB-NACL 420 G PO SOLR
4000.0000 mL | Freq: Once | ORAL | 0 refills | Status: AC
Start: 2021-03-24 — End: 2021-03-24

## 2021-03-24 NOTE — Progress Notes (Signed)

## 2021-03-30 DIAGNOSIS — S92015D Nondisplaced fracture of body of left calcaneus, subsequent encounter for fracture with routine healing: Secondary | ICD-10-CM | POA: Diagnosis not present

## 2021-04-05 ENCOUNTER — Encounter: Payer: Self-pay | Admitting: Gastroenterology

## 2021-04-08 ENCOUNTER — Other Ambulatory Visit: Payer: Self-pay

## 2021-04-08 ENCOUNTER — Encounter: Payer: Self-pay | Admitting: Gastroenterology

## 2021-04-08 ENCOUNTER — Ambulatory Visit (AMBULATORY_SURGERY_CENTER): Payer: BC Managed Care – PPO | Admitting: Gastroenterology

## 2021-04-08 VITALS — BP 151/112 | HR 84 | Temp 97.3°F | Resp 18 | Ht 63.0 in | Wt 209.0 lb

## 2021-04-08 DIAGNOSIS — Z1211 Encounter for screening for malignant neoplasm of colon: Secondary | ICD-10-CM

## 2021-04-08 MED ORDER — SODIUM CHLORIDE 0.9 % IV SOLN: 500.0000 mL | INTRAVENOUS | Status: DC

## 2021-04-08 NOTE — Progress Notes (Signed)
History and Physical:  This patient presents for endoscopic testing for: Encounter Diagnosis  Name Primary?   Colon cancer screening Yes   Patient has no complaints today.   Past Medical History: Past Medical History:  Diagnosis Date   Anemia    Hypertension      Past Surgical History: Past Surgical History:  Procedure Laterality Date   BREAST BIOPSY Left 2018   TUBAL LIGATION  2009    Allergies: No Known Allergies  Outpatient Meds: Current Outpatient Medications  Medication Sig Dispense Refill   amLODipine-olmesartan (AZOR) 5-20 MG tablet Take 1 tablet by mouth daily.     hydrochlorothiazide (HYDRODIURIL) 12.5 MG tablet Take 12.5 mg by mouth every morning.     Current Facility-Administered Medications  Medication Dose Route Frequency Provider Last Rate Last Admin   0.9 %  sodium chloride infusion  500 mL Intravenous Continuous Danis, Starr Lake III, MD          ___________________________________________________________________ Objective   Exam:  BP (!) 141/89   Pulse 92   Temp (!) 97.3 F (36.3 C)   Ht 5\' 3"  (1.6 m)   Wt 209 lb (94.8 kg)   LMP 04/01/2021 (Exact Date)   SpO2 98%   BMI 37.02 kg/m   CV: RRR without murmur, S1/S2 Resp: clear to auscultation bilaterally, normal RR and effort noted GI: soft, no tenderness, with active bowel sounds.   Assessment: Encounter Diagnosis  Name Primary?   Colon cancer screening Yes     Plan: Colonoscopy  The benefits and risks of the planned procedure were described in detail with the patient or (when appropriate) their health care proxy.  Risks were outlined as including, but not limited to, bleeding, infection, perforation, adverse medication reaction leading to cardiac or pulmonary decompensation, pancreatitis (if ERCP).  The limitation of incomplete mucosal visualization was also discussed.  No guarantees or warranties were given.    The patient is appropriate for an endoscopic procedure in the  ambulatory setting.   - 04/03/2021, MD

## 2021-04-08 NOTE — Patient Instructions (Addendum)
Read all of the handouts given to you by your recovery room nurse. ? ?YOU HAD AN ENDOSCOPIC PROCEDURE TODAY AT THE [facility]:   Refer to the procedure report that was given to you for any specific questions about what was found during the examination.  If the procedure report does not answer your questions, please call your gastroenterologist to clarify.  If you requested that your care partner not be given the details of your procedure findings, then the procedure report has been included in a sealed envelope for you to review at your convenience later. ? ?YOU SHOULD EXPECT: Some feelings of bloating in the abdomen. Passage of more gas than usual.  Walking can help get rid of the air that was put into your GI tract during the procedure and reduce the bloating. If you had a lower endoscopy (such as a colonoscopy or flexible sigmoidoscopy) you may notice spotting of blood in your stool or on the toilet paper. If you underwent a bowel prep for your procedure, you may not have a normal bowel movement for a few days. ? ?Please Note:  You might notice some irritation and congestion in your nose or some drainage.  This is from the oxygen used during your procedure.  There is no need for concern and it should clear up in a day or so. ? ?SYMPTOMS TO REPORT IMMEDIATELY: ? ?Following lower endoscopy (colonoscopy or flexible sigmoidoscopy): ? Excessive amounts of blood in the stool ? Significant tenderness or worsening of abdominal pains ? Swelling of the abdomen that is new, acute ? Fever of 100?F or higher ? ? ?For urgent or emergent issues, a gastroenterologist can be reached at any hour by calling [phone]. ?Do not use MyChart messaging for urgent concerns.  ? ? ?DIET:  We do recommend a small meal at first, but then you may proceed to your regular diet.  Drink plenty of fluids but you should avoid alcoholic beverages for 24 hours. ? ?ACTIVITY:  You should plan to take it easy for the rest of today and  you should NOT DRIVE or use heavy machinery until tomorrow (because of the sedation medicines used during the test).   ? ?FOLLOW UP: ?Our staff will call the number listed on your records 48-72 hours following your procedure to check on you and address any questions or concerns that you may have regarding the information given to you following your procedure. If we do not reach you, we will leave a message.  We will attempt to reach you two times.  During this call, we will ask if you have developed any symptoms of COVID 19. If you develop any symptoms (ie: fever, flu-like symptoms, shortness of breath, cough etc.) before then, please call [phone].  If you test positive for Covid 19 in the 2 weeks post procedure, please call and report this information to us.   ? ? ?SIGNATURES/CONFIDENTIALITY: ?You and/or your care partner have signed paperwork which will be entered into your electronic medical record.  These signatures attest to the fact that that the information above on your After Visit Summary has been reviewed and is understood.  Full responsibility of the confidentiality of this discharge information lies with you and/or your care-partner.  ?

## 2021-04-08 NOTE — Progress Notes (Signed)
Sedate, gd SR's, VSS, report to RN 

## 2021-04-08 NOTE — Progress Notes (Signed)
Vs CW I have reviewed the patient's medical history in detail and updated the computerized patient record.   

## 2021-04-08 NOTE — Op Note (Signed)
[facility] Patient Name: [name] Procedure Date: 04/08/2021 2:38 PM MRN: [id number] Endoscopist: Sherilyn Cooter L. Myrtie Neither , MD Age: 50 Referring MD:  Date of Birth: Jan 28, 1971 Gender: Female Account #: [id number] Procedure:                Colonoscopy Indications:              Screening for colorectal malignant neoplasm, This                            is the patient's first colonoscopy Medicines:                Monitored Anesthesia Care Procedure:                Pre-Anesthesia Assessment:                           - Prior to the procedure, a History and Physical                            was performed, and patient medications and                            allergies were reviewed. The patient's tolerance of                            previous anesthesia was also reviewed. The risks                            and benefits of the procedure and the sedation                            options and risks were discussed with the patient.                            All questions were answered, and informed consent                            was obtained. Prior Anticoagulants: The patient has                            taken no previous anticoagulant or antiplatelet                            agents. ASA Grade Assessment: II - A patient with                            mild systemic disease. After reviewing the risks                            and benefits, the patient was deemed in                            satisfactory condition to undergo the procedure.  After obtaining informed consent, the colonoscope                            was passed under direct vision. Throughout the                            procedure, the patient's blood pressure, pulse, and                            oxygen saturations were monitored continuously. The                            Colonoscope was introduced through the anus and                            advanced to the the cecum,  identified by                            appendiceal orifice and ileocecal valve. The                            colonoscopy was somewhat difficult due to a                            redundant colon and significant looping. Successful                            completion of the procedure was aided by changing                            the patient to a semi-prone position and using                            manual pressure. The patient tolerated the                            procedure well. The quality of the bowel                            preparation was excellent. The ileocecal valve,                            appendiceal orifice, and rectum were photographed. Scope In: 2:57:33 PM Scope Out: 3:15:21 PM Scope Withdrawal Time: 0 hours 9 minutes 54 seconds  Total Procedure Duration: 0 hours 17 minutes 48 seconds  Findings:                 The perianal and digital rectal examinations were                            normal.                           The entire examined colon appeared normal on direct  and retroflexion views. Complications:            No immediate complications. Estimated Blood Loss:     Estimated blood loss: none. Impression:               - The entire examined colon is normal on direct and                            retroflexion views.                           - No specimens collected. Recommendation:           - Patient has a contact number available for                            emergencies. The signs and symptoms of potential                            delayed complications were discussed with the                            patient. Return to normal activities tomorrow.                            Written discharge instructions were provided to the                            patient.                           - Resume previous diet.                           - Continue present medications.                           - Repeat  colonoscopy in 10 years for screening                            purposes. Teagon Kron L. Myrtie Neither, MD 04/08/2021 3:18:57 PM This report has been signed electronically.

## 2021-04-11 DIAGNOSIS — S92015D Nondisplaced fracture of body of left calcaneus, subsequent encounter for fracture with routine healing: Secondary | ICD-10-CM | POA: Diagnosis not present

## 2021-04-12 ENCOUNTER — Telehealth: Payer: Self-pay | Admitting: *Deleted

## 2021-04-12 NOTE — Telephone Encounter (Signed)
  Follow up Call-  Call back number 04/08/2021  Post procedure Call Back phone  # [phone]  Permission to leave phone message Yes  Some recent data might be hidden     Patient questions:  Do you have a fever, pain , or abdominal swelling? No. Pain Score  0 *  Have you tolerated food without any problems? Yes.    Have you been able to return to your normal activities? Yes.    Do you have any questions about your discharge instructions: Diet   No. Medications  No. Follow up visit  No.  Do you have questions or concerns about your Care? No.  Actions: * If pain score is 4 or above: No action needed, pain <4.  Have you developed a fever since your procedure? no  2.   Have you had an respiratory symptoms (SOB or cough) since your procedure? no  3.   Have you tested positive for COVID 19 since your procedure no  4.   Have you had any family members/close contacts diagnosed with the COVID 19 since your procedure?  no   If yes to any of these questions please route to Laverna Peace, RN and Karlton Lemon, RN

## 2021-07-14 IMAGING — MG DIGITAL SCREENING BILAT W/ TOMO W/ CAD
8 series · 8 of 24 positions shown · non-contrast
Comparison: Previous exam(s).

CLINICAL DATA: Screening.

EXAM:
DIGITAL SCREENING BILATERAL MAMMOGRAM WITH TOMO AND CAD

[R CC synth-2D]
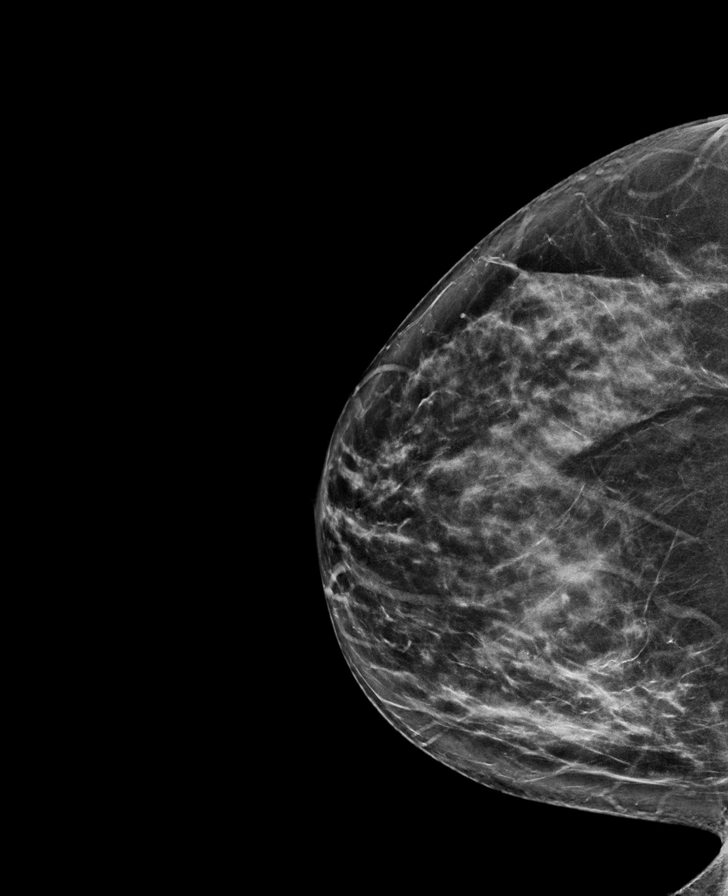

[R MLO synth-2D]
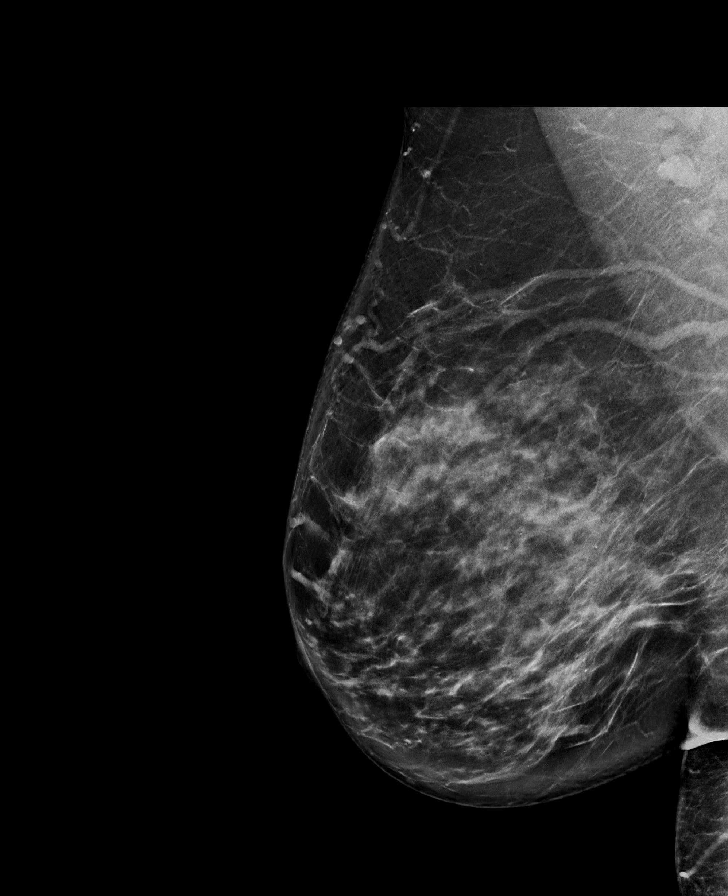

[L MLO synth-2D]
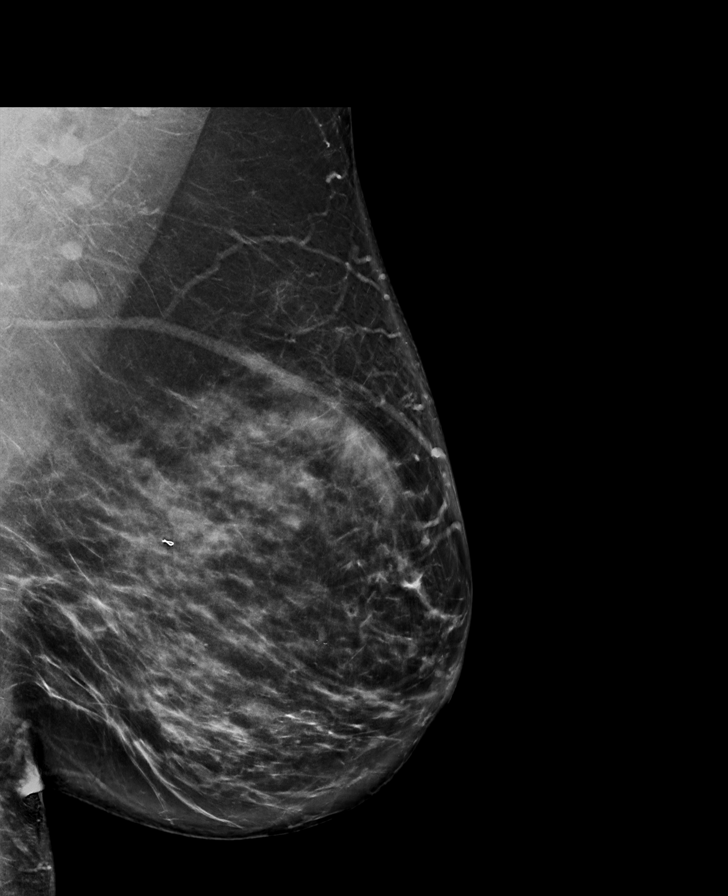

[L CC synth-2D]
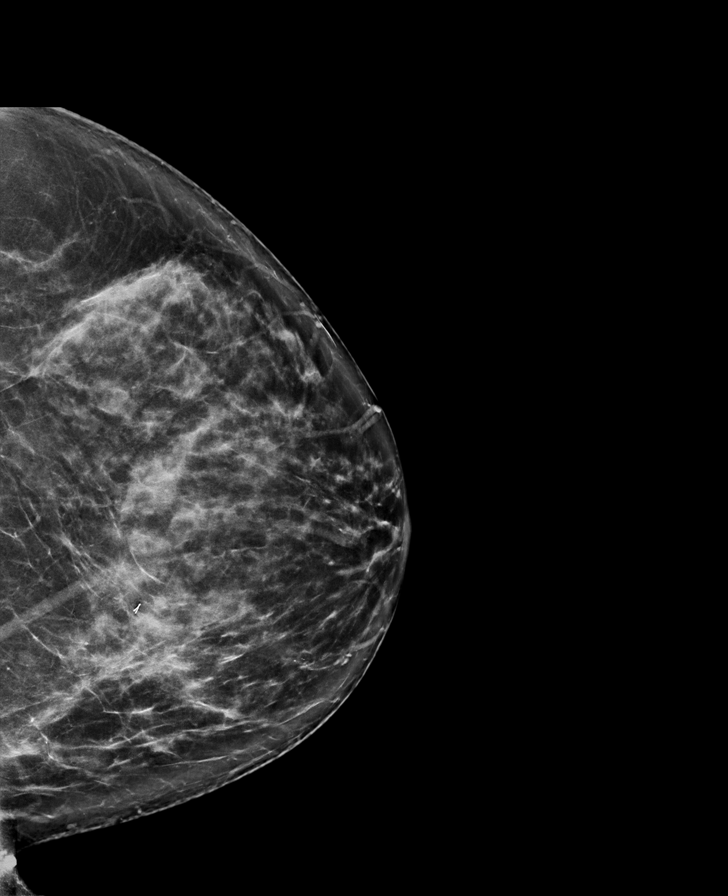

[L MLO tomo · tomo slice 47/94.0]
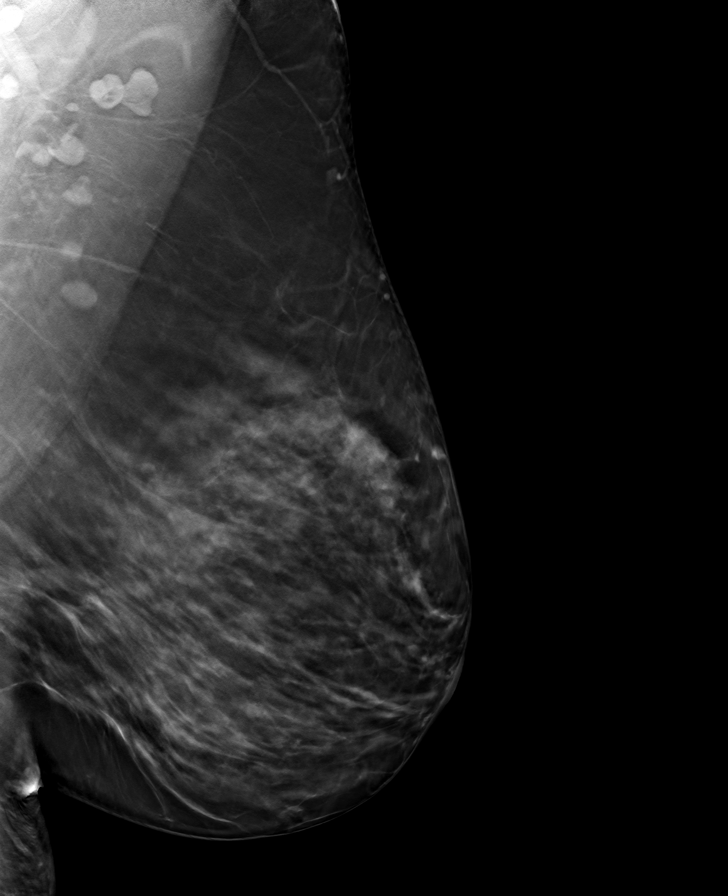

[R CC tomo · tomo slice 40/79.0]
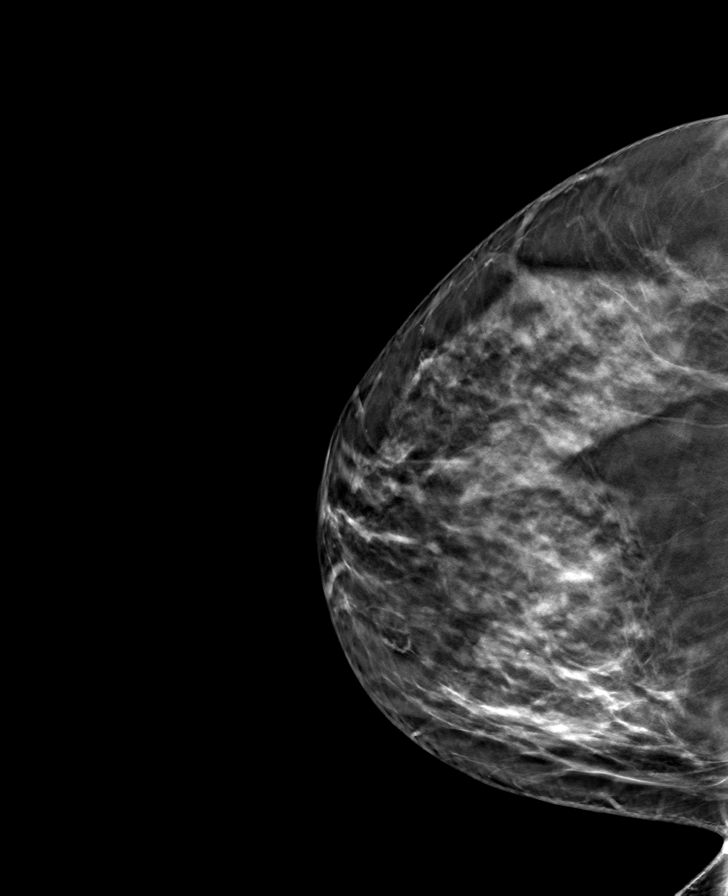

[R MLO tomo · tomo slice 48/95.0]
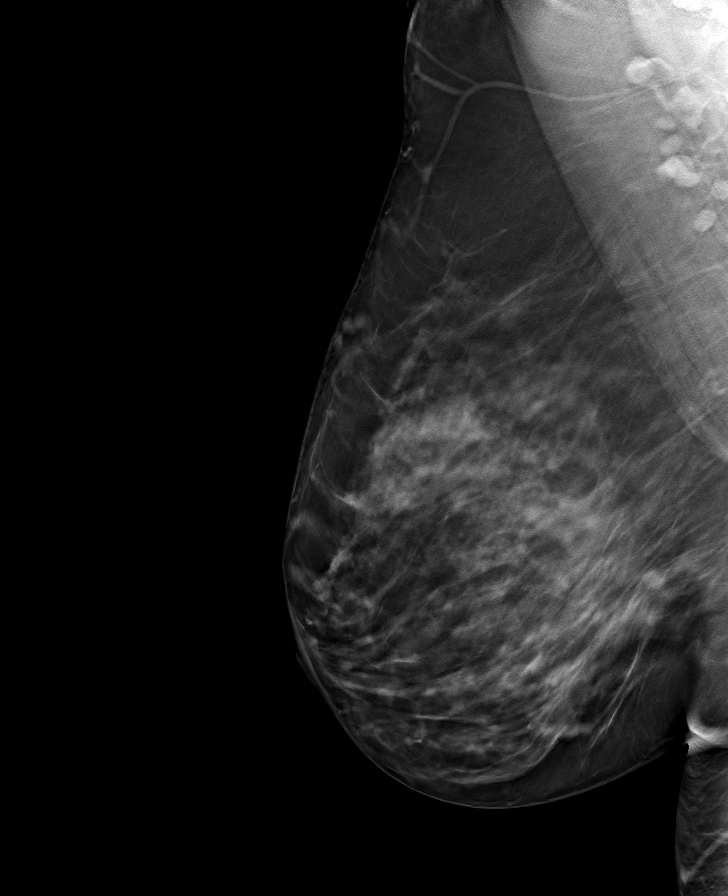

[L CC tomo · tomo slice 43/85.0]
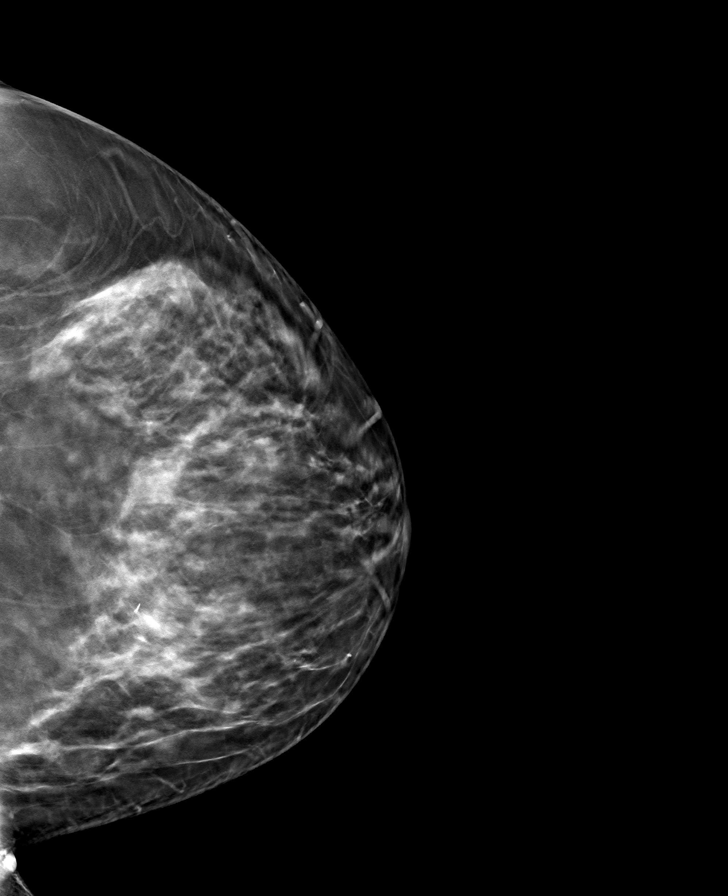

[8 of 24 positions shown; findings below may reference images not displayed]

ACR Breast Density Category c: The breast tissue is heterogeneously
dense, which may obscure small masses.
FINDINGS: There are no findings suspicious for malignancy. Images were
processed with CAD.
IMPRESSION: No mammographic evidence of malignancy. A result letter of this
screening mammogram will be mailed directly to the patient.

RECOMMENDATION:
Screening mammogram in one year. (Code:[id number])

BI-RADS CATEGORY  1: Negative.

## 2021-09-26 ENCOUNTER — Emergency Department (HOSPITAL_BASED_OUTPATIENT_CLINIC_OR_DEPARTMENT_OTHER): Payer: BC Managed Care – PPO | Admitting: Radiology

## 2021-09-26 ENCOUNTER — Emergency Department (HOSPITAL_BASED_OUTPATIENT_CLINIC_OR_DEPARTMENT_OTHER): Payer: BC Managed Care – PPO

## 2021-09-26 ENCOUNTER — Other Ambulatory Visit: Payer: Self-pay

## 2021-09-26 ENCOUNTER — Encounter (HOSPITAL_BASED_OUTPATIENT_CLINIC_OR_DEPARTMENT_OTHER): Payer: Self-pay | Admitting: Pediatrics

## 2021-09-26 ENCOUNTER — Emergency Department (HOSPITAL_BASED_OUTPATIENT_CLINIC_OR_DEPARTMENT_OTHER)
Admission: EM | Admit: 2021-09-26 | Discharge: 2021-09-26 | Disposition: A | Discharge status: Home / Self Care | Payer: BC Managed Care – PPO | Attending: Emergency Medicine | Admitting: Emergency Medicine

## 2021-09-26 DIAGNOSIS — N9489 Other specified conditions associated with female genital organs and menstrual cycle: Secondary | ICD-10-CM | POA: Insufficient documentation

## 2021-09-26 DIAGNOSIS — R079 Chest pain, unspecified: Secondary | ICD-10-CM | POA: Diagnosis not present

## 2021-09-26 DIAGNOSIS — I10 Essential (primary) hypertension: Secondary | ICD-10-CM | POA: Diagnosis not present

## 2021-09-26 DIAGNOSIS — R0789 Other chest pain: Secondary | ICD-10-CM | POA: Insufficient documentation

## 2021-09-26 DIAGNOSIS — D259 Leiomyoma of uterus, unspecified: Secondary | ICD-10-CM | POA: Diagnosis not present

## 2021-09-26 DIAGNOSIS — Z79899 Other long term (current) drug therapy: Secondary | ICD-10-CM | POA: Diagnosis not present

## 2021-09-26 DIAGNOSIS — K449 Diaphragmatic hernia without obstruction or gangrene: Secondary | ICD-10-CM | POA: Diagnosis not present

## 2021-09-26 DIAGNOSIS — J9811 Atelectasis: Secondary | ICD-10-CM | POA: Diagnosis not present

## 2021-09-26 LAB — CBC
HCT: 37.9 % (ref 36.0–46.0)
Hemoglobin: 11.5 g/dL — ABNORMAL LOW (ref 12.0–15.0)
MCH: 19.2 pg — ABNORMAL LOW (ref 26.0–34.0)
MCHC: 30.3 g/dL (ref 30.0–36.0)
MCV: 63.2 fL — ABNORMAL LOW (ref 80.0–100.0)
Platelets: 219 10*3/uL (ref 150–400)
RBC: 6 MIL/uL — ABNORMAL HIGH (ref 3.87–5.11)
RDW: 16.6 % — ABNORMAL HIGH (ref 11.5–15.5)
WBC: 8.5 10*3/uL (ref 4.0–10.5)
nRBC: 0 % (ref 0.0–0.2)

## 2021-09-26 LAB — TROPONIN I (HIGH SENSITIVITY)
Troponin I (High Sensitivity): 5 ng/L (ref <18)
Troponin I (High Sensitivity): 5 ng/L (ref <18)

## 2021-09-26 LAB — BASIC METABOLIC PANEL
Anion gap: 8 (ref 5–15)
BUN: 15 mg/dL (ref 6–20)
CO2: 23 mmol/L (ref 22–32)
Calcium: 9.4 mg/dL (ref 8.9–10.3)
Chloride: 107 mmol/L (ref 98–111)
Creatinine, Ser: 0.87 mg/dL (ref 0.44–1.00)
GFR, Estimated: >60 mL/min (ref >60)
Glucose, Bld: 111 mg/dL — ABNORMAL HIGH (ref 70–99)
Potassium: 4.1 mmol/L (ref 3.5–5.1)
Sodium: 138 mmol/L (ref 135–145)

## 2021-09-26 LAB — HCG, SERUM, QUALITATIVE: Preg, Serum: NEGATIVE

## 2021-09-26 MED ORDER — ASPIRIN 81 MG PO CHEW
324.0000 mg | CHEWABLE_TABLET | Freq: Once | ORAL | Status: AC
  Administered 2021-09-26: 324 mg via ORAL
  Filled 2021-09-26: qty 4

## 2021-09-26 MED ORDER — IOHEXOL 350 MG/ML SOLN
100.0000 mL | Freq: Once | INTRAVENOUS | Status: AC | PRN
  Administered 2021-09-26: 100 mL via INTRAVENOUS

## 2021-09-26 MED ORDER — NITROGLYCERIN 0.4 MG SL SUBL
0.4000 mg | SUBLINGUAL_TABLET | SUBLINGUAL | Status: DC | PRN
  Administered 2021-09-26: 0.4 mg via SUBLINGUAL
  Filled 2021-09-26: qty 1

## 2021-09-26 MED ORDER — AMLODIPINE BESYLATE 5 MG PO TABS
5.0000 mg | ORAL_TABLET | Freq: Once | ORAL | Status: AC
  Administered 2021-09-26: 5 mg via ORAL
  Filled 2021-09-26: qty 1

## 2021-09-26 NOTE — ED Triage Notes (Signed)
C/O sudden onset of severe chest pain started this morning when she woke up followed by difficulty to turn and get up from pain. Reported hx of high blood pressure controlled by diet and exercise

## 2021-09-26 NOTE — ED Provider Notes (Signed)
[facility] Provider Note   CSN: [id number] Arrival date & time: 09/26/21  0950     History  Chief Complaint  Patient presents with   Chest Pain    [name] is a 51 y.o. female.  Patient with no pertinent past medical history presents today with chest pain.  She states that this morning when she woke up she was pain-free, however she rolled over to get out of bed and had acute onset sharp stabbing chest pain on the left side of her chest that has been constant since then.  She states that her pain is relieved by sitting still and aggravated with any body movements.  She states that taking a deep breath also aggravates her pain.  She states that the pain does not radiate.  Pain is reproducible to palpation of her of the left side of her chest.  She denies any associated nausea or vomiting.  She does not smoke and denies any recreational drug use.  She denies any history of cardiac problems.  She does state that she has been told that she has high blood pressure, she was prescribed amlodipine for this but has not been taking it.  She states that she has been attempting to manage with lifestyle changes and has been checking her bp at home every morning with readings normally in the 140s over 80s. She denies any recent heavy lifting or overexertion. Denies any fevers, chills, or shortness of breath.  The history is provided by the patient. No language interpreter was used.  Chest Pain Associated symptoms: no abdominal pain, no back pain, no cough, no fever, no headache, no nausea, no palpitations, no shortness of breath and no vomiting       Home Medications Prior to Admission medications   Medication Sig Start Date End Date Taking? Authorizing Provider  amLODipine-olmesartan (AZOR) 5-20 MG tablet Take 1 tablet by mouth daily. 11/02/20   [provider]  hydrochlorothiazide (HYDRODIURIL) 12.5 MG tablet Take 12.5 mg by mouth every morning. 11/02/20    [provider]      Allergies    Patient has no known allergies.    Review of Systems   Review of Systems  Constitutional:  Negative for chills and fever.  Respiratory:  Negative for cough and shortness of breath.   Cardiovascular:  Positive for chest pain. Negative for palpitations and leg swelling.  Gastrointestinal:  Negative for abdominal pain, diarrhea, nausea and vomiting.  Musculoskeletal:  Negative for back pain.  Neurological:  Negative for headaches.  All other systems reviewed and are negative.  Physical Exam Updated Vital Signs BP (!) 195/121    Pulse 71    Temp 97.9 F (36.6 C)    Resp 16    Ht 5\' 3"  (1.6 m)    Wt 95.3 kg    SpO2 98%    BMI 37.20 kg/m  Physical Exam Vitals and nursing note reviewed.  Constitutional:      General: She is not in acute distress.    Appearance: She is well-developed. She is obese. She is not ill-appearing, toxic-appearing or diaphoretic.     Comments: Patient sitting comfortably in bed in no acute distress.  HENT:     Head: Normocephalic and atraumatic.  Eyes:     Pupils: Pupils are equal, round, and reactive to light.  Cardiovascular:     Rate and Rhythm: Normal rate and regular rhythm.     Pulses:  Radial pulses are 2+ on the right side and 2+ on the left side.     Heart sounds: Normal heart sounds.  Pulmonary:     Effort: Pulmonary effort is normal.     Breath sounds: Normal breath sounds.  Abdominal:     Palpations: Abdomen is soft.  Musculoskeletal:     Cervical back: Normal range of motion.     Right lower leg: No tenderness. No edema.     Left lower leg: No tenderness. No edema.  Skin:    General: Skin is warm and dry.  Neurological:     General: No focal deficit present.     Mental Status: She is alert.  Psychiatric:        Mood and Affect: Mood normal.        Behavior: Behavior normal.    ED Results / Procedures / Treatments   Labs (all labs ordered are listed, but only abnormal results are  displayed) Labs Reviewed  BASIC METABOLIC PANEL - Abnormal; Notable for the following components:      Result Value   Glucose, Bld 111 (*)    All other components within normal limits  CBC - Abnormal; Notable for the following components:   RBC 6.00 (*)    Hemoglobin 11.5 (*)    MCV 63.2 (*)    MCH 19.2 (*)    RDW 16.6 (*)    All other components within normal limits  HCG, SERUM, QUALITATIVE  TROPONIN I (HIGH SENSITIVITY)  TROPONIN I (HIGH SENSITIVITY)    EKG EKG Interpretation  Date/Time:  Monday September 26 2021 10:02:24 EST Ventricular Rate:  84 PR Interval:  174 QRS Duration: 78 QT Interval:  370 QTC Calculation: 437 R Axis:   58 Text Interpretation: Normal sinus rhythm with sinus arrhythmia Cannot rule out Anterior infarct (cited on or before 26-Sep-2021) Abnormal ECG When compared with ECG of 26-Sep-2021 09:59, No significant change was found Confirmed by Gareth Morgan [phone]) on 09/26/2021 11:14:13 AM  Radiology DG Chest 2 View  Result Date: 09/26/2021 CLINICAL DATA:  Left chest pain starting this morning. EXAM: CHEST - 2 VIEW COMPARISON:  09/30/2004 and CT chest 10/06/2009. FINDINGS: Trachea is midline. Heart size is accentuated by somewhat low lung volumes. There may be subsegmental atelectasis in the left lower lobe. Lungs are otherwise clear. No pleural fluid. IMPRESSION: Subsegmental atelectasis in the left lower lobe. Electronically Signed   By: Lorin Picket M.D.   On: 09/26/2021 10:25   CT Angio Chest/Abd/Pel for Dissection W and/or Wo Contrast  Result Date: 09/26/2021 CLINICAL DATA:  Sudden onset severe chest pain history of hypertension EXAM: CT ANGIOGRAPHY CHEST, ABDOMEN AND PELVIS TECHNIQUE: Non-contrast CT of the chest was initially obtained. Multidetector CT imaging through the chest, abdomen and pelvis was performed using the standard protocol during bolus administration of intravenous contrast. Multiplanar reconstructed images and MIPs were obtained and  reviewed to evaluate the vascular anatomy. RADIATION DOSE REDUCTION: This exam was performed according to the departmental dose-optimization program which includes automated exposure control, adjustment of the mA and/or kV according to patient size and/or use of iterative reconstruction technique. CONTRAST:  194mL OMNIPAQUE IOHEXOL 350 MG/ML SOLN COMPARISON:  Same day chest radiograph, CT abdomen/pelvis 01/18/2018, CT a chest 10/06/2009 FINDINGS: CTA CHEST FINDINGS Cardiovascular: There is no evidence of acute intramural hematoma on the initial noncontrast images. There is no evidence of thoracic aortic aneurysm or dissection. The heart is not enlarged. There is no pericardial effusion. Is no evidence of pulmonary  embolism. Mediastinum/Nodes: The thyroid is unremarkable. There is a small hiatal hernia. The esophagus is otherwise unremarkable. There is no mediastinal, hilar, or axillary lymphadenopathy. Lungs/Pleura: The trachea and central airways are patent. There is no focal consolidation or pulmonary edema. There is no pleural effusion or pneumothorax. There are no suspicious nodules. Musculoskeletal: There is no acute osseous abnormality or aggressive osseous lesion. Review of the MIP images confirms the above findings. CTA ABDOMEN AND PELVIS FINDINGS VASCULAR Aorta: Normal caliber aorta without aneurysm, dissection, vasculitis or significant stenosis. Celiac: Patent without evidence of aneurysm, dissection, vasculitis or significant stenosis. SMA: Patent without evidence of aneurysm, dissection, vasculitis or significant stenosis. Renals: Both renal arteries are patent without evidence of aneurysm, dissection, vasculitis, fibromuscular dysplasia or significant stenosis. IMA: Patent without evidence of aneurysm, dissection, vasculitis or significant stenosis. Inflow: Patent without evidence of aneurysm, dissection, vasculitis or significant stenosis. Veins: No obvious venous abnormality within the limitations of  this arterial phase study. Review of the MIP images confirms the above findings. NON-VASCULAR Hepatobiliary: The liver and gallbladder are unremarkable. There is no biliary ductal dilatation. Pancreas: Unremarkable. Spleen: Unremarkable. Adrenals/Urinary Tract: The adrenals are unremarkable. The kidneys are unremarkable, with no focal lesion, stone, hydronephrosis, or hydroureter. The bladder is unremarkable. Stomach/Bowel: There is a small hiatal hernia. The stomach is otherwise unremarkable. There is no evidence of bowel obstruction. There is no abnormal bowel wall thickening or inflammatory change. Lymphatic: There is no abdominopelvic lymphadenopathy. Reproductive: Uterus is enlarged with multiple fibroids, similar to the prior study. There is no adnexal mass. Other: There is no ascites or free air. Musculoskeletal: There is no acute osseous abnormality or aggressive osseous lesion. Review of the MIP images confirms the above findings. IMPRESSION: 1. No evidence of aortic dissection or aneurysm. 2. No other acute findings in the chest, abdomen, or pelvis. 3. Enlarged fibroid uterus. 4. Small hiatal hernia. Electronically Signed   By: Valetta Mole M.D.   On: 09/26/2021 13:07    Procedures Procedures    Medications Ordered in ED Medications - No data to display  ED Course/ Medical Decision Making/ A&P                           Medical Decision Making Amount and/or Complexity of Data Reviewed Labs: ordered. Radiology: ordered.  Risk OTC drugs. Prescription drug management.   This patient presents to the ED for concern of chest pain, this involves an extensive number of treatment options, and is a complaint that carries with it a high risk of complications and morbidity.    Co morbidities that complicate the patient evaluation  Obesity, hypertension   Lab Tests:  I Ordered, and personally interpreted labs.  The pertinent results include: No leukocytosis.  Slight anemia at 11.5,  unlikely to be etiology of patient symptoms.  No electrolyte abnormalities or changes in kidney function.  Troponin is negative   Imaging Studies ordered:  I ordered imaging studies including chest x-ray and CT angio dissection study I independently visualized and interpreted imaging which showed  CXR No acute findings CT angio 1. No evidence of aortic dissection or aneurysm. 2. No other acute findings in the chest, abdomen, or pelvis. 3. Enlarged fibroid uterus. 4. Small hiatal hernia. I agree with the radiologist interpretation   Cardiac Monitoring:  The patient was maintained on a cardiac monitor.  I personally viewed and interpreted the cardiac monitored which showed an underlying rhythm of: sinus rhythm   Medicines ordered  and prescription drug management:  I ordered medication including aspirin and nitroglycerin  for chest pain, amlodipine for  hypertension Reevaluation of the patient after these medicines showed that the patient improved I have reviewed the patients home medicines and have made adjustments as needed   Test Considered:  I considered d-dimer, however patient is PERC negative   Reevaluation:  After the interventions noted above, I reevaluated the patient and found that they have :improved   Dispostion:  After consideration of the diagnostic results and the patients response to treatment, I feel that the patent would benefit from outpatient management of symptoms with close return precautions and PCP follow-up.   Given the large differential diagnosis for Nea B Shehata, the decision making in this case is of high complexity.  After evaluating all of the data points in this case, the presentation of Terryl B Methvin is NOT consistent with Acute Coronary Syndrome (ACS) and/or myocardial ischemia, pulmonary embolism, aortic dissection; Borhaave's, significant arrythmia, pneumothorax, cardiac tamponade, or other emergent cardiopulmonary  condition.  Further, the presentation of Rekita B Peacock is NOT consistent with pericarditis, myocarditis, cholecystitis, pancreatitis, mediastinitis, endocarditis, new valvular disease.  Additionally, the presentation of Carmita B Herbinis NOT consistent with flail chest, cardiac contusion, ARDS, or significant intra-thoracic or intra-abdominal bleeding.  Moreover, this presentation is NOT consistent with pneumonia, sepsis, or pyelonephritis.  The patient has a   HEART score of 2, low risk for myocardial ischemia.   Given presentation of symptoms which include pain that is reproducible to palpation of the chest wall and pain that is exacerbated by movements, suspect that patient's pain is musculoskeletal in nature.  Upon reevaluation, her pain is significantly improved.  She is afebrile, nontoxic-appearing, and in no acute distress with reassuring vital signs.  I have discussed the importance of having her blood pressure managed with her primary doctor, she expresses understanding.  She is stable for discharge at this time.  Educated on red flag symptoms of prompt immediate return.  Discharged in stable condition.   Strict return and follow-up precautions have been given by me personally or by detailed written instruction given verbally by nursing staff using the teach back method to the patient/family/caregiver(s).  Data Reviewed/Counseling: I have reviewed the patient's vital signs, nursing notes, and other relevant tests/information. I had a detailed discussion regarding the historical points, exam findings, and any diagnostic results supporting the discharge diagnosis. I also discussed the need for outpatient follow-up and the need to return to the ED if symptoms worsen or if there are any questions or concerns that arise at home.  Final Clinical Impression(s) / ED Diagnoses Final diagnoses:  Chest pain, unspecified type    Rx / DC Orders ED Discharge Orders     None     An After  Visit Summary was printed and given to the patient.     Nestor Lewandowsky 09/26/21 1508    Gareth Morgan, MD 09/27/21 [phone]

## 2021-09-26 NOTE — Discharge Instructions (Signed)
As we discussed, your work-up in the ER today was reassuring for acute abnormalities.  Given the presentation of your symptoms, I suspect that your pain is musculoskeletal in nature.  Therefore would like for you to manage with rest, ibuprofen, and a heating pad on the area that hurts.  Additionally, it is very important that you get your blood pressure under control.  Please check your blood pressure at the same time every day and keep a journal of these readings to bring to your doctor at your next visit.  I recommend close PCP follow-up in the next week.  Return if development of any new or worsening symptoms.

## 2021-09-29 DIAGNOSIS — N898 Other specified noninflammatory disorders of vagina: Secondary | ICD-10-CM | POA: Diagnosis not present

## 2021-11-22 DIAGNOSIS — Z0000 Encounter for general adult medical examination without abnormal findings: Secondary | ICD-10-CM | POA: Diagnosis not present

## 2021-11-22 DIAGNOSIS — L659 Nonscarring hair loss, unspecified: Secondary | ICD-10-CM | POA: Diagnosis not present

## 2021-11-22 DIAGNOSIS — Z131 Encounter for screening for diabetes mellitus: Secondary | ICD-10-CM | POA: Diagnosis not present

## 2021-11-22 DIAGNOSIS — E669 Obesity, unspecified: Secondary | ICD-10-CM | POA: Diagnosis not present

## 2021-11-22 DIAGNOSIS — I10 Essential (primary) hypertension: Secondary | ICD-10-CM | POA: Diagnosis not present

## 2021-11-22 DIAGNOSIS — Z13220 Encounter for screening for lipoid disorders: Secondary | ICD-10-CM | POA: Diagnosis not present

## 2022-02-02 DIAGNOSIS — Z1231 Encounter for screening mammogram for malignant neoplasm of breast: Secondary | ICD-10-CM | POA: Diagnosis not present

## 2022-02-06 DIAGNOSIS — N6011 Diffuse cystic mastopathy of right breast: Secondary | ICD-10-CM | POA: Diagnosis not present

## 2022-02-06 DIAGNOSIS — R928 Other abnormal and inconclusive findings on diagnostic imaging of breast: Secondary | ICD-10-CM | POA: Diagnosis not present

## 2022-02-21 DIAGNOSIS — H40013 Open angle with borderline findings, low risk, bilateral: Secondary | ICD-10-CM | POA: Diagnosis not present

## 2022-03-19 DIAGNOSIS — U071 COVID-19: Secondary | ICD-10-CM | POA: Diagnosis not present

## 2022-03-19 DIAGNOSIS — R112 Nausea with vomiting, unspecified: Secondary | ICD-10-CM | POA: Diagnosis not present

## 2022-05-25 DIAGNOSIS — R7303 Prediabetes: Secondary | ICD-10-CM | POA: Diagnosis not present

## 2022-05-25 DIAGNOSIS — I10 Essential (primary) hypertension: Secondary | ICD-10-CM | POA: Diagnosis not present

## 2022-07-12 ENCOUNTER — Emergency Department (HOSPITAL_COMMUNITY): Payer: BC Managed Care – PPO

## 2022-07-12 ENCOUNTER — Emergency Department (HOSPITAL_COMMUNITY)
Admission: EM | Admit: 2022-07-12 | Discharge: 2022-07-12 | Disposition: A | Discharge status: Home / Self Care | Payer: BC Managed Care – PPO | Attending: Emergency Medicine | Admitting: Emergency Medicine

## 2022-07-12 ENCOUNTER — Other Ambulatory Visit: Payer: Self-pay

## 2022-07-12 DIAGNOSIS — D259 Leiomyoma of uterus, unspecified: Secondary | ICD-10-CM | POA: Diagnosis not present

## 2022-07-12 DIAGNOSIS — R1011 Right upper quadrant pain: Secondary | ICD-10-CM | POA: Diagnosis not present

## 2022-07-12 DIAGNOSIS — R109 Unspecified abdominal pain: Secondary | ICD-10-CM | POA: Insufficient documentation

## 2022-07-12 DIAGNOSIS — N852 Hypertrophy of uterus: Secondary | ICD-10-CM | POA: Diagnosis not present

## 2022-07-12 DIAGNOSIS — K449 Diaphragmatic hernia without obstruction or gangrene: Secondary | ICD-10-CM | POA: Diagnosis not present

## 2022-07-12 LAB — COMPREHENSIVE METABOLIC PANEL
ALT: 14 U/L (ref 0–44)
AST: 16 U/L (ref 15–41)
Albumin: 4.1 g/dL (ref 3.5–5.0)
Alkaline Phosphatase: 53 U/L (ref 38–126)
Anion gap: 7 (ref 5–15)
BUN: 11 mg/dL (ref 6–20)
CO2: 23 mmol/L (ref 22–32)
Calcium: 9.2 mg/dL (ref 8.9–10.3)
Chloride: 110 mmol/L (ref 98–111)
Creatinine, Ser: 0.76 mg/dL (ref 0.44–1.00)
GFR, Estimated: >60 mL/min (ref >60)
Glucose, Bld: 102 mg/dL — ABNORMAL HIGH (ref 70–99)
Potassium: 3.5 mmol/L (ref 3.5–5.1)
Sodium: 140 mmol/L (ref 135–145)
Total Bilirubin: 0.5 mg/dL (ref 0.3–1.2)
Total Protein: 7.7 g/dL (ref 6.5–8.1)

## 2022-07-12 LAB — URINALYSIS, ROUTINE W REFLEX MICROSCOPIC
Bilirubin Urine: NEGATIVE
Glucose, UA: NEGATIVE mg/dL
Hgb urine dipstick: NEGATIVE
Ketones, ur: 5 mg/dL — AB
Leukocytes,Ua: NEGATIVE
Nitrite: NEGATIVE
Protein, ur: NEGATIVE mg/dL
Specific Gravity, Urine: 1.02 (ref 1.005–1.030)
pH: 6 (ref 5.0–8.0)

## 2022-07-12 LAB — CBC WITH DIFFERENTIAL/PLATELET
Abs Immature Granulocytes: 0.04 10*3/uL (ref 0.00–0.07)
Basophils Absolute: 0.1 10*3/uL (ref 0.0–0.1)
Basophils Relative: 1 %
Eosinophils Absolute: 0.2 10*3/uL (ref 0.0–0.5)
Eosinophils Relative: 1 %
HCT: 37.9 % (ref 36.0–46.0)
Hemoglobin: 11.6 g/dL — ABNORMAL LOW (ref 12.0–15.0)
Immature Granulocytes: 0 %
Lymphocytes Relative: 18 %
Lymphs Abs: 2.1 10*3/uL (ref 0.7–4.0)
MCH: 19.9 pg — ABNORMAL LOW (ref 26.0–34.0)
MCHC: 30.6 g/dL (ref 30.0–36.0)
MCV: 64.9 fL — ABNORMAL LOW (ref 80.0–100.0)
Monocytes Absolute: 0.9 10*3/uL (ref 0.1–1.0)
Monocytes Relative: 8 %
Neutro Abs: 8.2 10*3/uL — ABNORMAL HIGH (ref 1.7–7.7)
Neutrophils Relative %: 72 %
Platelets: 224 10*3/uL (ref 150–400)
RBC: 5.84 MIL/uL — ABNORMAL HIGH (ref 3.87–5.11)
RDW: 17.7 % — ABNORMAL HIGH (ref 11.5–15.5)
WBC: 11.4 10*3/uL — ABNORMAL HIGH (ref 4.0–10.5)
nRBC: 0 % (ref 0.0–0.2)

## 2022-07-12 LAB — PREGNANCY, URINE: Preg Test, Ur: NEGATIVE

## 2022-07-12 LAB — LIPASE, BLOOD: Lipase: 28 U/L (ref 11–51)

## 2022-07-12 MED ORDER — IRBESARTAN 150 MG PO TABS
150.0000 mg | ORAL_TABLET | Freq: Once | ORAL | Status: AC
  Administered 2022-07-12: 150 mg via ORAL
  Filled 2022-07-12: qty 1

## 2022-07-12 MED ORDER — HYDROCODONE-ACETAMINOPHEN 5-325 MG PO TABS
1.0000 | ORAL_TABLET | Freq: Once | ORAL | Status: AC
  Administered 2022-07-12: 1 via ORAL
  Filled 2022-07-12: qty 1

## 2022-07-12 MED ORDER — AMLODIPINE BESYLATE 5 MG PO TABS
5.0000 mg | ORAL_TABLET | Freq: Once | ORAL | Status: AC
  Administered 2022-07-12: 5 mg via ORAL
  Filled 2022-07-12: qty 1

## 2022-07-12 MED ORDER — AMLODIPINE-OLMESARTAN 5-20 MG PO TABS
1.0000 | ORAL_TABLET | Freq: Once | ORAL | Status: DC
Start: 2022-07-12 — End: 2022-07-12

## 2022-07-12 NOTE — ED Notes (Signed)
Pt needs HCG for CT scan 

## 2022-07-12 NOTE — ED Provider Triage Note (Signed)
Emergency Medicine Provider Triage Evaluation Note  [name] , a 51 y.o. female  was evaluated in triage.  Pt complains of right flank pain.  Patient states symptoms began 2 days ago beginning in her right back.  She reports radiation to the right flank area this morning.  States that the pain has been significantly worse today.  Motrin and Tylenol helped her symptoms today.  Denies nausea, vomiting, association with food.  She does note that pain has been slightly worse with urinating.  Denies history of kidney stones.  Denies dysuria, urinary frequency/hematuria, vaginal symptoms, change in bowel habits.  Review of Systems  Positive: See above Negative:   Physical Exam  BP (!) 195/109   Pulse (!) 101   Temp 98.3 F (36.8 C) (Oral)   Resp 20   Ht 5\' 3"  (1.6 m)   Wt 93.4 kg   SpO2 100%   BMI 36.49 kg/m  Gen:   Awake, no distress   Resp:  Normal effort  MSK:   Moves extremities without difficulty  Other:  No obvious right-sided or left-sided CVA tenderness.  Possible right upper quadrant tenderness.  Medical Decision Making  Medically screening exam initiated at 5:09 PM.  Appropriate orders placed.  Leahna B Timpone was informed that the remainder of the evaluation will be completed by another provider, this initial triage assessment does not replace that evaluation, and the importance of remaining in the ED until their evaluation is complete.     , Peter Garter 07/12/22 1710

## 2022-07-12 NOTE — ED Triage Notes (Signed)
Pt reports right flank pain worse when urinating x 2 days. Denies n/v/d.

## 2022-07-12 NOTE — Discharge Instructions (Signed)
  Return for any problem.  Use ibuprofen as discussed for pain control.  If you develop increased pain, fever, rash to affected area please return for evaluation.  As discussed, development of rash after 1 to 2 days of pain could be a shingles outbreak.  This would require antiviral medication for treatment.

## 2022-07-12 NOTE — ED Provider Notes (Signed)
[facility]-EMERGENCY DEPT Provider Note   CSN: [id number] Arrival date & time: 07/12/22  1612     History  Chief Complaint  Patient presents with   Flank Pain    [name] is a 51 y.o. female.  51 year old female with prior medical history as detailed below presents for evaluation.  Patient complains of right-sided flank pain.  She localizes the pain to the right flank just along the inferior lateral aspect of the right ribs.  Patient reports that pain began approximately 2 days ago.  Patient reports that she took Motrin and Tylenol at home which helped significantly with her pain.  She denies associated nausea, vomiting, other abdominal pain, difficulty urinating.  She denies bloody stools.  She denies urinary symptoms or blood in urine.  The history is provided by the patient and medical records.       Home Medications Prior to Admission medications   Medication Sig Start Date End Date Taking? Authorizing Provider  amLODipine-olmesartan (AZOR) 5-20 MG tablet Take 1 tablet by mouth daily. 11/02/20   [provider]  hydrochlorothiazide (HYDRODIURIL) 12.5 MG tablet Take 12.5 mg by mouth every morning. 11/02/20   [provider]      Allergies    Patient has no known allergies.    Review of Systems   Review of Systems  All other systems reviewed and are negative.   Physical Exam Updated Vital Signs BP (!) 178/107   Pulse 67   Temp (!) 97.3 F (36.3 C) (Oral)   Resp 20   Ht 5\' 3"  (1.6 m)   Wt 93.4 kg   SpO2 98%   BMI 36.49 kg/m  Physical Exam Vitals and nursing note reviewed.  Constitutional:      General: She is not in acute distress.    Appearance: Normal appearance. She is well-developed.  HENT:     Head: Normocephalic and atraumatic.  Eyes:     Conjunctiva/sclera: Conjunctivae normal.     Pupils: Pupils are equal, round, and reactive to light.  Cardiovascular:     Rate and Rhythm: Normal rate and regular  rhythm.     Heart sounds: Normal heart sounds.  Pulmonary:     Effort: Pulmonary effort is normal. No respiratory distress.     Breath sounds: Normal breath sounds.  Abdominal:     General: There is no distension.     Palpations: Abdomen is soft.     Tenderness: There is no abdominal tenderness.  Musculoskeletal:        General: No deformity. Normal range of motion.     Cervical back: Normal range of motion and neck supple.  Skin:    General: Skin is warm and dry.     Comments: No evidence of rash consistent with possible early shingles along the right flank.  Skin is clean and dry overlying the thorax on the right.  Neurological:     General: No focal deficit present.     Mental Status: She is alert and oriented to person, place, and time.     ED Results / Procedures / Treatments   Labs (all labs ordered are listed, but only abnormal results are displayed) Labs Reviewed  COMPREHENSIVE METABOLIC PANEL - Abnormal; Notable for the following components:      Result Value   Glucose, Bld 102 (*)    All other components within normal limits  CBC WITH DIFFERENTIAL/PLATELET - Abnormal; Notable for the following components:   WBC 11.4 (*)  RBC 5.84 (*)    Hemoglobin 11.6 (*)    MCV 64.9 (*)    MCH 19.9 (*)    RDW 17.7 (*)    Neutro Abs 8.2 (*)    All other components within normal limits  URINALYSIS, ROUTINE W REFLEX MICROSCOPIC - Abnormal; Notable for the following components:   Ketones, ur 5 (*)    All other components within normal limits  LIPASE, BLOOD  PREGNANCY, URINE    EKG None  Radiology CT Renal Stone Study  Result Date: 07/12/2022 CLINICAL DATA:  Right flank pain. EXAM: CT ABDOMEN AND PELVIS WITHOUT CONTRAST TECHNIQUE: Multidetector CT imaging of the abdomen and pelvis was performed following the standard protocol without IV contrast. RADIATION DOSE REDUCTION: This exam was performed according to the departmental dose-optimization program which includes  automated exposure control, adjustment of the mA and/or kV according to patient size and/or use of iterative reconstruction technique. COMPARISON:  None Available. FINDINGS: Lower chest: No acute abnormality. Hepatobiliary: No focal liver abnormality is seen. No gallstones, gallbladder wall thickening, or biliary dilatation. Pancreas: Unremarkable. No pancreatic ductal dilatation or surrounding inflammatory changes. Spleen: Normal in size without focal abnormality. Adrenals/Urinary Tract: Adrenal glands are unremarkable. Kidneys are normal, without renal calculi, focal lesion, or hydronephrosis. Bladder is unremarkable. Stomach/Bowel: There is a small, stable hiatal hernia. Appendix appears normal. No evidence of bowel wall thickening, distention, or inflammatory changes. Vascular/Lymphatic: No significant vascular findings are present. No enlarged abdominal or pelvic lymph nodes. Reproductive: Uterus is enlarged and lobulated in appearance. A 9.0 cm x 8.3 cm x 8.8 cm uterine soft tissue mass is seen within the region of the uterine fundus. The bilateral adnexa are unremarkable. Other: No abdominal wall hernia or abnormality. No abdominopelvic ascites. Musculoskeletal: No acute or significant osseous findings. IMPRESSION: 1. Small hiatal hernia. 2. Enlarged fibroid uterus. Electronically Signed   By: Aram Candela M.D.   On: 07/12/2022 22:11   US Abdomen Limited RUQ (LIVER/GB)  Result Date: 07/12/2022 CLINICAL DATA:  Right upper quadrant abdominal pain EXAM: ULTRASOUND ABDOMEN LIMITED RIGHT UPPER QUADRANT COMPARISON:  CT scan 09/26/2021 FINDINGS: Gallbladder: No gallstones or wall thickening visualized. No sonographic Murphy sign noted by sonographer. Common bile duct: Diameter: 0.4 cm Liver: No focal lesion identified. Within normal limits in parenchymal echogenicity. Portal vein is patent on color Doppler imaging with normal direction of blood flow towards the liver. Other: None. IMPRESSION: 1. Normal  hepatobiliary ultrasound. Electronically Signed   By: Gaylyn Rong M.D.   On: 07/12/2022 21:35    Procedures Procedures    Medications Ordered in ED Medications  HYDROcodone-acetaminophen (NORCO/VICODIN) 5-325 MG per tablet 1 tablet (1 tablet Oral Given 07/12/22 1719)  amLODipine (NORVASC) tablet 5 mg (5 mg Oral Given 07/12/22 2200)    And  irbesartan (AVAPRO) tablet 150 mg (150 mg Oral Given 07/12/22 2201)    ED Course/ Medical Decision Making/ A&P                           Medical Decision Making Amount and/or Complexity of Data Reviewed Radiology: ordered.  Risk Prescription drug management.    Medical Screen Complete  This patient presented to the ED with complaint of right flank pain.  This complaint involves an extensive number of treatment options. The initial differential diagnosis includes, but is not limited to, renal colic, biliary colic, other intra-abdominal pathology, shingles, metabolic abnormality, etc.  This presentation is: Acute, Self-Limited, Previously Undiagnosed, Uncertain Prognosis, Complicated, Systemic  Symptoms, and Threat to Life/Bodily Function  Patient is presenting with right-sided flank discomfort.  Exam is without evidence of significant acute findings.  Notably there is no evidence of rash consistent with possible shingles.  Additional work-up including labs, CT abdomen pelvis, right upper quadrant ultrasound is without evidence of acute pathology.  Patient has remained comfortable during the entirety of her ED evaluation.  She is comfortable with plan for discharge home.  She does understand need to return to seek treatment if she develops any sort of rash consistent with possible shingles, develops a fever, has increased uncontrolled pain.  Importance of close follow-up was stressed.  Strict return precautions given and understood.    Additional history obtained:  External records from outside sources obtained and reviewed  including prior ED visits and prior Inpatient records.    Lab Tests:  I ordered and personally interpreted labs.  The pertinent results include: CBC, CMP, UA, hCG, lipase   Imaging Studies ordered:  I ordered imaging studies including CT abdomen pelvis, right upper quadrant ultrasound I independently visualized and interpreted obtained imaging which showed NAD I agree with the radiologist interpretation.   Cardiac Monitoring:  The patient was maintained on a cardiac monitor.  I personally viewed and interpreted the cardiac monitor which showed an underlying rhythm of: NSR   Medicines ordered:  I ordered medication including antihypertensive medications, Norco for poorly controlled blood pressure, pain Reevaluation of the patient after these medicines showed that the patient: improved   Problem List / ED Course:  Right flank pain, poorly controlled hypertension   Reevaluation:  After the interventions noted above, I reevaluated the patient and found that they have: improved Disposition:  After consideration of the diagnostic results and the patients response to treatment, I feel that the patent would benefit from close outpatient follow-up.          Final Clinical Impression(s) / ED Diagnoses Final diagnoses:  Right flank pain    Rx / DC Orders ED Discharge Orders     None         Wynetta Fines, MD 07/12/22 2249

## 2023-03-20 DIAGNOSIS — R92333 Mammographic heterogeneous density, bilateral breasts: Secondary | ICD-10-CM | POA: Diagnosis not present

## 2023-03-20 DIAGNOSIS — Z1231 Encounter for screening mammogram for malignant neoplasm of breast: Secondary | ICD-10-CM | POA: Diagnosis not present

## 2023-03-20 LAB — HM MAMMOGRAPHY

## 2023-05-01 DIAGNOSIS — H40013 Open angle with borderline findings, low risk, bilateral: Secondary | ICD-10-CM | POA: Diagnosis not present

## 2023-05-15 ENCOUNTER — Encounter: Payer: Self-pay | Admitting: Physician Assistant

## 2023-05-15 ENCOUNTER — Ambulatory Visit: Payer: BC Managed Care – PPO | Admitting: Physician Assistant

## 2023-05-15 VITALS — BP 150/100 | HR 92 | Temp 97.3°F | Ht 64.0 in | Wt 209.2 lb

## 2023-05-15 DIAGNOSIS — I10 Essential (primary) hypertension: Secondary | ICD-10-CM | POA: Insufficient documentation

## 2023-05-15 DIAGNOSIS — E88819 Insulin resistance, unspecified: Secondary | ICD-10-CM | POA: Diagnosis not present

## 2023-05-15 DIAGNOSIS — R29818 Other symptoms and signs involving the nervous system: Secondary | ICD-10-CM

## 2023-05-15 DIAGNOSIS — N951 Menopausal and female climacteric states: Secondary | ICD-10-CM | POA: Diagnosis not present

## 2023-05-15 LAB — COMPREHENSIVE METABOLIC PANEL
ALT: 10 U/L (ref 0–35)
AST: 14 U/L (ref 0–37)
Albumin: 4.2 g/dL (ref 3.5–5.2)
Alkaline Phosphatase: 61 U/L (ref 39–117)
BUN: 11 mg/dL (ref 6–23)
CO2: 26 meq/L (ref 19–32)
Calcium: 9.5 mg/dL (ref 8.4–10.5)
Chloride: 105 meq/L (ref 96–112)
Creatinine, Ser: 0.77 mg/dL (ref 0.40–1.20)
GFR: 90.11 mL/min (ref >60.00)
Glucose, Bld: 118 mg/dL — ABNORMAL HIGH (ref 70–99)
Potassium: 3.7 meq/L (ref 3.5–5.1)
Sodium: 138 meq/L (ref 135–145)
Total Bilirubin: 0.3 mg/dL (ref 0.2–1.2)
Total Protein: 7.8 g/dL (ref 6.0–8.3)

## 2023-05-15 LAB — CBC WITH DIFFERENTIAL/PLATELET
Basophils Absolute: 0 10*3/uL (ref 0.0–0.1)
Basophils Relative: 0.5 % (ref 0.0–3.0)
Eosinophils Absolute: 0.2 10*3/uL (ref 0.0–0.7)
Eosinophils Relative: 2.3 % (ref 0.0–5.0)
HCT: 38.1 % (ref 36.0–46.0)
Hemoglobin: 11.3 g/dL — ABNORMAL LOW (ref 12.0–15.0)
Lymphocytes Relative: 22.5 % (ref 12.0–46.0)
Lymphs Abs: 2 10*3/uL (ref 0.7–4.0)
MCHC: 29.7 g/dL — ABNORMAL LOW (ref 30.0–36.0)
MCV: 63.7 fL — ABNORMAL LOW (ref 78.0–100.0)
Monocytes Absolute: 0.7 10*3/uL (ref 0.1–1.0)
Monocytes Relative: 8.3 % (ref 3.0–12.0)
Neutro Abs: 6 10*3/uL (ref 1.4–7.7)
Neutrophils Relative %: 66.4 % (ref 43.0–77.0)
Platelets: 196 10*3/uL (ref 150.0–400.0)
RBC: 5.98 Mil/uL — ABNORMAL HIGH (ref 3.87–5.11)
RDW: 16.7 % — ABNORMAL HIGH (ref 11.5–15.5)
WBC: 9 10*3/uL (ref 4.0–10.5)

## 2023-05-15 LAB — HEMOGLOBIN A1C: Hgb A1c MFr Bld: 6.6 % — ABNORMAL HIGH (ref 4.6–6.5)

## 2023-05-15 LAB — LIPID PANEL
Cholesterol: 141 mg/dL (ref 0–200)
HDL: 50 mg/dL (ref >39.00)
LDL Cholesterol: 81 mg/dL (ref 0–99)
NonHDL: 90.94
Total CHOL/HDL Ratio: 3
Triglycerides: 51 mg/dL (ref 0.0–149.0)
VLDL: 10.2 mg/dL (ref 0.0–40.0)

## 2023-05-15 MED ORDER — AMLODIPINE-OLMESARTAN 10-40 MG PO TABS: 1.0000 | ORAL_TABLET | Freq: Every day | ORAL | 3 refills | Status: DC

## 2023-05-15 MED ORDER — VEOZAH 45 MG PO TABS: 45.0000 mg | ORAL_TABLET | Freq: Every day | ORAL | 0 refills | Status: DC

## 2023-05-15 NOTE — Progress Notes (Signed)
[name] is a 52 y.o. female here for a new problem.  History of Present Illness:   Chief Complaint  Patient presents with   Establish Care   Hot Flashes    Pt c/o hot flashes at night x 1-2 months    HPI  Patient is here to establish care  From labs on April of 2023 at [facility] : A1c 6.3, LDL 80, Thyroid normal   Hot Flashes  She complains today of experiencing hot flashes. Her flashes tend to occur at night and would cause her to wake up from her sleep.  Her last menstrual cycle was last month but she was experiencing missed menstrual cycles.  She is also experiencing accompanying sleep disturbance and snoring as a result.  She is interested in starting a medication to help with her symptoms.  Her brother has been diagnosed with sleep apnea recently. She is interested in pursuing a sleep study  - she does snore and has daytime fatigue.  Elevated blood pressure  She states that her blood pressure readings have been elevated for some time now. She is currently taking amlodipine-olmesartan 5-20 mg. Her medication regimen was last adjusted about 1-2 years ago.  She had started with amlodipine 5 mg which cause her to experience swelling She also takes hydrochlorothiazide 12.5 mg as needed. Typically once a week.  She does check her blood pressure at home ut not daily. Her reading this morning was 147/92.  She reports having a low sodium diet and states that her caffeine intake is every now and then.  Pre-Diabetes  Her recent A1c in October 2023 was 6.4 She is not interested in starting any injectables to help with weight loss.  She is interested in losing weight through diet and exercise. Mother and father grandmothers have a history of diabetes.   Past Medical History:  Diagnosis Date   Anemia    Hypertension    Vaginal delivery    1996, 2004, 2009     Social History   Tobacco Use   Smoking status: Never    Passive exposure: Never   Smokeless tobacco: Never   Vaping Use   Vaping status: Never Used  Substance Use Topics   Alcohol use: Yes    Alcohol/week: 2.0 standard drinks of alcohol    Types: 2 Glasses of wine per week   Drug use: Never    Past Surgical History:  Procedure Laterality Date   BREAST BIOPSY Left 2018   TUBAL LIGATION  2009    Family History  Problem Relation Age of Onset   Hypertension Mother    Heart disease Father    Drug abuse Father    Alcohol abuse Father    Hypertension Sister    Hypertension Brother    Hypertension Maternal Grandmother    Diabetes Maternal Grandmother    Hypertension Paternal Grandmother    Diabetes Paternal Grandmother    Colon cancer Neg Hx    Esophageal cancer Neg Hx    Stomach cancer Neg Hx    Rectal cancer Neg Hx     No Known Allergies  Current Medications:   Current Outpatient Medications:    amLODipine-olmesartan (AZOR) 10-40 MG tablet, Take 1 tablet by mouth daily., Disp: 90 tablet, Rfl: 3   Biotin-Vitamin C (HAIR SKIN NAILS GUMMIES PO), Take 2 each by mouth daily in the afternoon., Disp: , Rfl:    Fezolinetant (VEOZAH) 45 MG TABS, Take 1 tablet (45 mg total) by mouth daily., Disp: 90 tablet,  Rfl: 0   hydrochlorothiazide (HYDRODIURIL) 12.5 MG tablet, Take 12.5 mg by mouth every morning., Disp: , Rfl:    Probiotic Product (PROBIOTIC-10 ULTIMATE) CAPS, Take 1 capsule by mouth daily in the afternoon., Disp: , Rfl:    Review of Systems:   Review of Systems  Neurological:        +hot flashes  Psychiatric/Behavioral:  The patient has insomnia.     Vitals:   Vitals:   05/15/23 0858 05/15/23 0928  BP: (!) 160/100 (!) 150/100  Pulse: 92   Temp: (!) 97.3 F (36.3 C)   TempSrc: Temporal   SpO2: 97%   Weight: 209 lb 4 oz (94.9 kg)   Height: 5\' 4"  (1.626 m)      Body mass index is 35.92 kg/m.  Physical Exam:   Physical Exam Vitals and nursing note reviewed.  Constitutional:      General: She is not in acute distress.    Appearance: She is well-developed. She is  not ill-appearing or toxic-appearing.  Cardiovascular:     Rate and Rhythm: Normal rate and regular rhythm.     Pulses: Normal pulses.     Heart sounds: Normal heart sounds, S1 normal and S2 normal.  Pulmonary:     Effort: Pulmonary effort is normal.     Breath sounds: Normal breath sounds.  Skin:    General: Skin is warm and dry.  Neurological:     Mental Status: She is alert.     GCS: GCS eye subscore is 4. GCS verbal subscore is 5. GCS motor subscore is 6.  Psychiatric:        Speech: Speech normal.        Behavior: Behavior normal. Behavior is cooperative.     Assessment and Plan:   Primary hypertension Above goal today No evidence of end-organ damage on my exam Recommend patient monitor home blood pressure at least a few times weekly Increase medication to amlodipine 10 - olmesartan 20 mg daily Continue hydrochloroTHIAZIDE as needed  If home monitoring shows consistent elevation, or any symptom(s) develop, recommend reach out to [place] for further advice on next steps Follow-up in 3 months, sooner if concerns  Insulin resistance Recheck A1c today Declines medication  Perimenopausal vasomotor symptoms Reviewed options Will trial Veozah -- risks/benefits/side effect(s) discussed Follow-up in 3 months for repeat LFTs, sooner if concerns She is amenable to other medications if this does not work out  Suspected sleep apnea Suspect OSA Referral to [facility]    [place], PA-C  I,Safa Hewitt Shorts as a scribe for Jarold Motto, PA.,have documented all relevant documentation on the behalf of Jarold Motto, PA,as directed by  Jarold Motto, PA while in the presence of Jarold Motto, [place].   I, Jarold Motto, [place], have reviewed all documentation for this visit. The documentation on 05/15/23 for the exam, diagnosis, procedures, and orders are all accurate and complete.

## 2023-05-15 NOTE — Patient Instructions (Addendum)
It was great to see you!  Blood work today  Chiropractor to 10-40 mg daily  Use coupon card to pick up [employer]    Let's follow-up in 3 months, sooner if you have concerns.  Take care,  Jarold Motto PA-C

## 2023-07-02 ENCOUNTER — Telehealth: Payer: Self-pay | Admitting: Physician Assistant

## 2023-07-02 NOTE — Telephone Encounter (Signed)
Pt states the meds that were RX to her make her legs swell up and wanted to know if she can get an RX for water pills. Please advise.

## 2023-07-02 NOTE — Telephone Encounter (Signed)
Please call pt an schedule an appt for swelling in her legs per Lelon Mast.

## 2023-07-02 NOTE — Telephone Encounter (Signed)
Please see message and advise 

## 2023-07-03 ENCOUNTER — Ambulatory Visit: Payer: BC Managed Care – PPO | Admitting: Physician Assistant

## 2023-07-03 ENCOUNTER — Encounter: Payer: Self-pay | Admitting: Physician Assistant

## 2023-07-03 VITALS — BP 146/90 | HR 85 | Temp 97.5°F | Ht 64.0 in | Wt 213.5 lb

## 2023-07-03 DIAGNOSIS — M7989 Other specified soft tissue disorders: Secondary | ICD-10-CM | POA: Diagnosis not present

## 2023-07-03 DIAGNOSIS — E669 Obesity, unspecified: Secondary | ICD-10-CM | POA: Diagnosis not present

## 2023-07-03 DIAGNOSIS — I10 Essential (primary) hypertension: Secondary | ICD-10-CM | POA: Diagnosis not present

## 2023-07-03 MED ORDER — OLMESARTAN MEDOXOMIL-HCTZ 40-25 MG PO TABS: 1.0000 | ORAL_TABLET | Freq: Every day | ORAL | 1 refills | Status: DC

## 2023-07-03 NOTE — Patient Instructions (Signed)
It was great to see you!  Stop amlodopine-olmesartan  Start olmesartan-hydrochloroTHIAZIDE  Consider Ozempic!   Let's follow-up in 1 month, sooner if you have concerns.  Take care,  Jarold Motto PA-C

## 2023-07-03 NOTE — Progress Notes (Signed)
[name] is a 52 y.o. female here for a follow-up of a pre-existing problem.  History of Present Illness:   Chief Complaint  Patient presents with   Leg Swelling    Pt c/o bilateral leg swelling, notified last week.   HPI  LE Edema (Bilateral): Reports that she ran out of her hydrochlorothiazide and noticed that her legs have been swelling.  Notes that she does elevate her feet at the end of the day and the swelling has been mild.  Is taking Amlodipine-Olmesartan 10-40 mg and 12.5 mg Hydrochlorothiazide for hypertension. After our last visit, we increased her Amlodipine from 5 to 10. States that she has been checking her blood pressure at home with the highest systolic numbers being about 150.  BP Readings from Last 3 Encounters:  07/03/23 (!) 146/90  05/15/23 (!) 150/100  07/12/22 (!) 175/105   Weight Loss: She reports that she would like to lose some weight to maintain a healthier lifestyle and control her hypertension and blood sugars better.  Notes that she does eat healthy but doesn't often have time to exercise as she prefers to have a partner with her. Discussed weight loss medications.   Lab Results  Component Value Date   HGBA1C 6.6 (H) 05/15/2023   Past Medical History:  Diagnosis Date   Anemia    Hypertension    Vaginal delivery    1996, 2004, 2009    Social History   Tobacco Use   Smoking status: Never    Passive exposure: Never   Smokeless tobacco: Never  Vaping Use   Vaping status: Never Used  Substance Use Topics   Alcohol use: Yes    Alcohol/week: 2.0 standard drinks of alcohol    Types: 2 Glasses of wine per week   Drug use: Never   Past Surgical History:  Procedure Laterality Date   BREAST BIOPSY Left 2018   TUBAL LIGATION  2009   Family History  Problem Relation Age of Onset   Hypertension Mother    Heart disease Father    Drug abuse Father    Alcohol abuse Father    Hypertension Sister    Hypertension Brother    Hypertension  Maternal Grandmother    Diabetes Maternal Grandmother    Hypertension Paternal Grandmother    Diabetes Paternal Grandmother    Colon cancer Neg Hx    Esophageal cancer Neg Hx    Stomach cancer Neg Hx    Rectal cancer Neg Hx    No Known Allergies Current Medications:   Current Outpatient Medications:    Biotin-Vitamin C (HAIR SKIN NAILS GUMMIES PO), Take 2 each by mouth daily in the afternoon., Disp: , Rfl:    olmesartan-hydrochlorothiazide (BENICAR HCT) 40-25 MG tablet, Take 1 tablet by mouth daily., Disp: 90 tablet, Rfl: 1   Probiotic Product (PROBIOTIC-10 ULTIMATE) CAPS, Take 1 capsule by mouth daily in the afternoon., Disp: , Rfl:   Review of Systems:   ROS See pertinent positives and negatives as per the HPI.  Vitals:   Vitals:   07/03/23 1017 07/03/23 1032  BP: (!) 150/90 (!) 146/90  Pulse: 85   Temp: (!) 97.5 F (36.4 C)   TempSrc: Temporal   SpO2: 98%   Weight: 213 lb 8 oz (96.8 kg)   Height: 5\' 4"  (1.626 m)      Body mass index is 36.65 kg/m.  Physical Exam:   Physical Exam Vitals and nursing note reviewed.  Constitutional:      General:  She is not in acute distress.    Appearance: She is well-developed. She is not ill-appearing or toxic-appearing.  Cardiovascular:     Rate and Rhythm: Normal rate and regular rhythm.     Pulses: Normal pulses.     Heart sounds: Normal heart sounds, S1 normal and S2 normal.  Pulmonary:     Effort: Pulmonary effort is normal.     Breath sounds: Normal breath sounds.  Musculoskeletal:     Right lower leg: 1+ Edema present.     Left lower leg: 1+ Edema present.  Skin:    General: Skin is warm and dry.  Neurological:     Mental Status: She is alert.     GCS: GCS eye subscore is 4. GCS verbal subscore is 5. GCS motor subscore is 6.  Psychiatric:        Speech: Speech normal.        Behavior: Behavior normal. Behavior is cooperative.     Assessment and Plan:   Primary hypertension Above goal today No evidence of  end-organ damage on my exam Recommend patient monitor home blood pressure at least a few times weekly Advised as follows: Stop amlodopine-olmesartan Start olmesartan-hydrochloroTHIAZIDE Follow-up in 1 month If home monitoring shows consistent elevation, or any symptom(s) develop, recommend reach out to [place] for further advice on next steps  Leg swelling Suspect due to amlodipine Stop amlodopine, reduce salt, elevate legs, work on exercise Follow-up in 1 month, if persists, will consider cardiology referral  Obesity, unspecified class, unspecified obesity type, unspecified whether serious comorbidity present She is motivated to start exercising Discussed glp-1s -- she would like to re-visit this conversation at our 1 month follow-up  I,Emily Lagle,acting as a scribe for [employer], PA.,have documented all relevant documentation on the behalf of Jarold Motto, PA,as directed by  Jarold Motto, PA while in the presence of Jarold Motto, [place].  I, Jarold Motto, [place], have reviewed all documentation for this visit. The documentation on 07/03/23 for the exam, diagnosis, procedures, and orders are all accurate and complete.  Jarold Motto, PA-C

## 2023-08-02 ENCOUNTER — Ambulatory Visit: Payer: BC Managed Care – PPO | Admitting: Physician Assistant

## 2023-08-09 ENCOUNTER — Ambulatory Visit: Payer: BC Managed Care – PPO | Admitting: Physician Assistant

## 2023-08-09 ENCOUNTER — Encounter: Payer: Self-pay | Admitting: Physician Assistant

## 2023-08-09 VITALS — BP 134/70 | HR 84 | Temp 97.8°F | Ht 64.0 in | Wt 213.6 lb

## 2023-08-09 DIAGNOSIS — I10 Essential (primary) hypertension: Secondary | ICD-10-CM

## 2023-08-09 DIAGNOSIS — E669 Obesity, unspecified: Secondary | ICD-10-CM | POA: Diagnosis not present

## 2023-08-09 NOTE — Progress Notes (Signed)
[name] is a 52 y.o. female here for a follow-up of a pre-existing problem.  History of Present Illness:   Chief Complaint  Patient presents with   Medical Management of Chronic Issues   Hypertension   HPI  Hypertension: Currently managed with 40-25 mg Olmesartan-Hydrochlorothiazide since 07/03/23. Reports that she has been tolerating this since starting.  Was previously Amlodipine-Olmesartan 10-40 mg and 12.5 mg Hydrochlorothiazide which was halted and switch after she reported LE edema after running out of her Hydrochlorothiazide  Monitored at home and with normal readings with the highest being 126/86.  Denies excessive caffeine intake, stimulant usage, excessive alcohol intake, or increase in salt consumption.  Denies chest pain, shortness of breath, blurred vision, dizziness, unusual headaches, or lower leg swelling.  BP Readings from Last 3 Encounters:  08/09/23 134/70  07/03/23 (!) 146/90  05/15/23 (!) 150/100   Obesity: Discussed 11/19, she had decided to increase her exercise and follow-up in a month to decide if she would like to start a GLP-1 for further weight loss management.  She has decided to continue to monitor her weight with her life-style changes.  Wt Readings from Last 3 Encounters:  08/09/23 213 lb 9.6 oz (96.9 kg)  07/03/23 213 lb 8 oz (96.8 kg)  05/15/23 209 lb 4 oz (94.9 kg)   Past Medical History:  Diagnosis Date   Anemia    Hypertension    Vaginal delivery    1996, 2004, 2009    Social History   Tobacco Use   Smoking status: Never    Passive exposure: Never   Smokeless tobacco: Never  Vaping Use   Vaping status: Never Used  Substance Use Topics   Alcohol use: Yes    Alcohol/week: 2.0 standard drinks of alcohol    Types: 2 Glasses of wine per week   Drug use: Never   Past Surgical History:  Procedure Laterality Date   BREAST BIOPSY Left 2018   TUBAL LIGATION  2009   Family History  Problem Relation Age of Onset    Hypertension Mother    Heart disease Father    Drug abuse Father    Alcohol abuse Father    Hypertension Sister    Hypertension Brother    Hypertension Maternal Grandmother    Diabetes Maternal Grandmother    Hypertension Paternal Grandmother    Diabetes Paternal Grandmother    Colon cancer Neg Hx    Esophageal cancer Neg Hx    Stomach cancer Neg Hx    Rectal cancer Neg Hx    No Known Allergies Current Medications:   Current Outpatient Medications:    Biotin-Vitamin C (HAIR SKIN NAILS GUMMIES PO), Take 2 each by mouth daily in the afternoon., Disp: , Rfl:    olmesartan-hydrochlorothiazide (BENICAR HCT) 40-25 MG tablet, Take 1 tablet by mouth daily., Disp: 90 tablet, Rfl: 1   Probiotic Product (PROBIOTIC-10 ULTIMATE) CAPS, Take 1 capsule by mouth daily in the afternoon., Disp: , Rfl:   Review of Systems:   ROS See pertinent positives and negatives as per the HPI.  Vitals:   Vitals:   08/09/23 1043 08/09/23 1056  BP: (!) 140/70 134/70  Pulse: 84   Temp: 97.8 F (36.6 C)   SpO2: 98%   Weight: 213 lb 9.6 oz (96.9 kg)   Height: 5\' 4"  (1.626 m)      Body mass index is 36.66 kg/m.  Physical Exam:   Physical Exam Vitals and nursing note reviewed.  Constitutional:  General: She is not in acute distress.    Appearance: She is well-developed. She is not ill-appearing or toxic-appearing.  Cardiovascular:     Rate and Rhythm: Normal rate and regular rhythm.     Pulses: Normal pulses.     Heart sounds: Normal heart sounds, S1 normal and S2 normal.  Pulmonary:     Effort: Pulmonary effort is normal.     Breath sounds: Normal breath sounds.  Skin:    General: Skin is warm and dry.  Neurological:     Mental Status: She is alert.     GCS: GCS eye subscore is 4. GCS verbal subscore is 5. GCS motor subscore is 6.  Psychiatric:        Speech: Speech normal.        Behavior: Behavior normal. Behavior is cooperative.     Assessment and Plan:   Primary  hypertension Normotensive Continue  40-25 mg Olmesartan-Hydrochlorothiazide Follow-up in 1-3 months to check on A1c and review hypertension control  Obesity, unspecified class, unspecified obesity type, unspecified whether serious comorbidity present Continue efforts at healthy lifestyle Offered GLP-1 but she declines Follow-up in 1-3 months to check on A1c and continue to monitor weight   I,Emily Lagle,acting as a scribe for [employer], PA.,have documented all relevant documentation on the behalf of Jarold Motto, PA,as directed by  Jarold Motto, PA while in the presence of Jarold Motto, [place].  I, Jarold Motto, [place], have reviewed all documentation for this visit. The documentation on 08/09/23 for the exam, diagnosis, procedures, and orders are all accurate and complete.  Jarold Motto, PA-C

## 2023-08-16 ENCOUNTER — Ambulatory Visit: Payer: BC Managed Care – PPO | Admitting: Physician Assistant

## 2023-10-10 ENCOUNTER — Encounter: Payer: Self-pay | Admitting: Physician Assistant

## 2023-10-10 ENCOUNTER — Ambulatory Visit: Payer: BC Managed Care – PPO | Admitting: Physician Assistant

## 2023-10-10 VITALS — BP 122/80 | HR 78 | Temp 97.5°F | Ht 64.0 in | Wt 213.0 lb

## 2023-10-10 DIAGNOSIS — I10 Essential (primary) hypertension: Secondary | ICD-10-CM | POA: Diagnosis not present

## 2023-10-10 DIAGNOSIS — E88819 Insulin resistance, unspecified: Secondary | ICD-10-CM | POA: Diagnosis not present

## 2023-10-10 LAB — POCT GLYCOSYLATED HEMOGLOBIN (HGB A1C): Hemoglobin A1C: 6.5 % — AB (ref 4.0–5.6)

## 2023-10-10 NOTE — Progress Notes (Signed)
 [name] is a 53 y.o. female here for a follow up of a pre-existing problem.  History of Present Illness:   Chief Complaint  Patient presents with   Hypertension    Pt here for f/u on Bp, has been checking off and on and has been in normal range.    HPI  HTN  Reports compliance and good tolerance of 40-25 mg Olmesartan-Hydrochlorothiazide.  At home BP reading have been within the normal ranges.  No concerns or symptoms at this time. Patient denies chest pain, SOB, blurred vision, dizziness, unusual headaches, lower leg swelling. Patient is compliant with medication. Denies excessive caffeine intake, stimulant usage, excessive alcohol intake, or increase in salt  Diabetes  A1c today is 6.5 compared to 6.6 previously. She states that she did adjust her diet since her last office visit as per recommendation.  Exercise levels have not been adjusted due to cold weather.  She is willing to consider starting GLP1 medication. She denies being on metformin previously.  Denies hypoglycemic or hyperglycemic episodes or symptoms.   Past Medical History:  Diagnosis Date   Anemia    Hypertension    Vaginal delivery    1996, 2004, 2009     Social History   Tobacco Use   Smoking status: Never    Passive exposure: Never   Smokeless tobacco: Never  Vaping Use   Vaping status: Never Used  Substance Use Topics   Alcohol use: Yes    Alcohol/week: 2.0 standard drinks of alcohol    Types: 2 Glasses of wine per week   Drug use: Never    Past Surgical History:  Procedure Laterality Date   BREAST BIOPSY Left 2018   TUBAL LIGATION  2009    Family History  Problem Relation Age of Onset   Hypertension Mother    Heart disease Father    Drug abuse Father    Alcohol abuse Father    Hypertension Sister    Hypertension Brother    Hypertension Maternal Grandmother    Diabetes Maternal Grandmother    Hypertension Paternal Grandmother    Diabetes Paternal Grandmother    Colon  cancer Neg Hx    Esophageal cancer Neg Hx    Stomach cancer Neg Hx    Rectal cancer Neg Hx     No Known Allergies  Current Medications:   Current Outpatient Medications:    Biotin-Vitamin C (HAIR SKIN NAILS GUMMIES PO), Take 2 each by mouth daily in the afternoon., Disp: , Rfl:    olmesartan-hydrochlorothiazide (BENICAR HCT) 40-25 MG tablet, Take 1 tablet by mouth daily., Disp: 90 tablet, Rfl: 1   Probiotic Product (PROBIOTIC-10 ULTIMATE) CAPS, Take 1 capsule by mouth daily in the afternoon., Disp: , Rfl:    Review of Systems:   ROS Negative unless otherwise specified per HPI.  Vitals:   Vitals:   10/10/23 0954  BP: 122/80  Pulse: 78  Temp: (!) 97.5 F (36.4 C)  TempSrc: Temporal  SpO2: 99%  Weight: 213 lb (96.6 kg)  Height: 5\' 4"  (1.626 m)     Body mass index is 36.56 kg/m.  Physical Exam:   Physical Exam Vitals and nursing note reviewed.  Constitutional:      General: She is not in acute distress.    Appearance: She is well-developed. She is not ill-appearing or toxic-appearing.  Cardiovascular:     Rate and Rhythm: Normal rate and regular rhythm.     Pulses: Normal pulses.     Heart sounds:  Normal heart sounds, S1 normal and S2 normal.  Pulmonary:     Effort: Pulmonary effort is normal.     Breath sounds: Normal breath sounds.  Skin:    General: Skin is warm and dry.  Neurological:     Mental Status: She is alert.     GCS: GCS eye subscore is 4. GCS verbal subscore is 5. GCS motor subscore is 6.  Psychiatric:        Speech: Speech normal.        Behavior: Behavior normal. Behavior is cooperative.    Results for orders placed or performed in visit on 10/10/23  POCT glycosylated hemoglobin (Hb A1C)  Result Value Ref Range   Hemoglobin A1C 6.5 (A) 4.0 - 5.6 %    Assessment and Plan:   Insulin resistance A1c is overall stable She continues to decline prescriptions Encouraged increase in physical activity and ongoing improvement in food  choices Follow-up in 3-6 months, sooner if concerns I did give her handout on Rybelsus and she will let us know if she would like to take this  Primary hypertension Normotensive Continue olmesartan-hydrochloroTHIAZIDE 40-25 mg daily Follow-up in 3-6 month(s), sooner if concerns   Jarold Motto, PA-C  I,Safa M Kadhim,acting as a scribe for Jarold Motto, PA.,have documented all relevant documentation on the behalf of Jarold Motto, PA,as directed by  Jarold Motto, PA while in the presence of Jarold Motto, [place].   I, Jarold Motto, [place], have reviewed all documentation for this visit. The documentation on 10/10/23 for the exam, diagnosis, procedures, and orders are all accurate and complete.

## 2023-10-10 NOTE — Patient Instructions (Signed)
 It was great to see you!  Blood pressure is excellent  You have had a SLIGHT improvement in your Hemoglobin A1c but you are still in the diabetic range  We can consider Rybelsus -- let me know your thoughts  Let's follow-up in 3-6 months, sooner if you have concerns.  Take care,  Jarold Motto PA-C

## 2023-12-28 ENCOUNTER — Other Ambulatory Visit: Payer: Self-pay | Admitting: Physician Assistant

## 2024-03-13 ENCOUNTER — Ambulatory Visit: Payer: BC Managed Care – PPO | Admitting: Physician Assistant

## 2024-03-13 ENCOUNTER — Encounter: Payer: Self-pay | Admitting: Physician Assistant

## 2024-03-13 VITALS — BP 140/90 | HR 70 | Temp 97.3°F | Ht 64.0 in | Wt 213.5 lb

## 2024-03-13 DIAGNOSIS — E88819 Insulin resistance, unspecified: Secondary | ICD-10-CM | POA: Diagnosis not present

## 2024-03-13 DIAGNOSIS — I10 Essential (primary) hypertension: Secondary | ICD-10-CM

## 2024-03-13 LAB — COMPREHENSIVE METABOLIC PANEL WITH GFR
ALT: 11 U/L (ref 0–35)
AST: 14 U/L (ref 0–37)
Albumin: 4.4 g/dL (ref 3.5–5.2)
Alkaline Phosphatase: 50 U/L (ref 39–117)
BUN: 15 mg/dL (ref 6–23)
CO2: 29 meq/L (ref 19–32)
Calcium: 10 mg/dL (ref 8.4–10.5)
Chloride: 102 meq/L (ref 96–112)
Creatinine, Ser: 0.86 mg/dL (ref 0.40–1.20)
GFR: 78.46 mL/min (ref >60.00)
Glucose, Bld: 114 mg/dL — ABNORMAL HIGH (ref 70–99)
Potassium: 3.7 meq/L (ref 3.5–5.1)
Sodium: 136 meq/L (ref 135–145)
Total Bilirubin: 0.4 mg/dL (ref 0.2–1.2)
Total Protein: 8 g/dL (ref 6.0–8.3)

## 2024-03-13 LAB — POCT GLYCOSYLATED HEMOGLOBIN (HGB A1C): Hemoglobin A1C: 6.4 % — AB (ref 4.0–5.6)

## 2024-03-13 NOTE — Patient Instructions (Addendum)
 It was great to see you!  Please try to start walking a few days a week!  Your blood pressure is elevated in our office today.  I recommend that you monitor this at home.  Your goal blood pressure should be around < 130/80, unless you are over 51 years old, your goal may be closer to 140-150/90. Please note if you have been given other goals from a cardiologist or other healthcare provider, please defer to their recommendations.  When preparing to take your blood pressure: Plan ahead. Don't smoke, drink caffeine or exercise within 30 minutes before taking your blood pressure. Empty your bladder. Don't take the measurement over clothes. Remove the clothing over the arm that will be used to measure blood pressure. You can use either arm unless otherwise told by a healthcare provider. Usually there is not a big difference between readings on them. Be still. Allow at least five minutes of quiet rest before measurements. Don't talk or use the phone. Sit correctly. Sit with your back straight and supported (on a dining chair, rather than a sofa). Your feet should be flat on the floor. Do not cross your legs. Support your arm on a flat surface. The middle of the cuff should be placed on the upper arm at heart level.  Measure at the same time of the day. Take multiple readings and record the results. Each time you measure, take two readings one minute apart. Record the results and bring in to your next office visit.  In order to know how well the medication is working, I would like you to take your readings 1-2 hours after taking your blood pressure medication if possible. Take your blood pressure measurements and record 2-3 days per week.  If you get a high blood pressure reading: A single high reading is not an immediate cause for alarm. If you get a reading that is higher than normal, take your blood pressure a second time. Write down the results of both measurements. Check with your health care  professional to see if there's a health concern or whether there may be problems with your monitor. If your blood pressure readings are suddenly higher than 180/120 mm Hg, wait at least one minute and test again. If your readings are still very high, contact your health care professional immediately. You could be having a hypertensive crisis. Call 911 if your blood pressure is higher than 180/120 mm Hg and if you are having new signs or symptoms that may include: Chest pain Shortness of breath Back pain Numbness Weakness Change in vision Difficulty speaking Confusion Dizziness Vomiting  Take care,  Lucie Buttner PA-C

## 2024-03-13 NOTE — Progress Notes (Signed)
 [name] is a 53 y.o. female here for a follow up of a pre-existing problem.  History of Present Illness:   Chief Complaint  Patient presents with   Medical Management of Chronic Issues    Here for 3 month f/u Hypertension, Insulin resistance.    HPI  Discussed the use of AI scribe software for clinical note transcription with the patient, who gave verbal consent to proceed.  History of Present Illness [name] is a 53 year old female with type 2 diabetes and hypertension who presents for routine follow-up.  Insulin resistance - Type 2 diabetes mellitus with recent hemoglobin A1c decreased from 6.5% to 6.4% - Attributes improvement to increased water intake and reduced candy consumption - Uncertain if stress impacts blood glucose levels  Blood pressure management - Hypertension with recent home blood pressure reading of 136/79 mmHg - Did not check blood pressure on the morning of the visit - Took blood pressure medication prior to the visit  Lifestyle modification - Considering regular exercise; owns a walking pad at home but has only used it a few times - Attempting to establish a routine to increase physical activity - Attempts to eat healthily when dining out by avoiding soda and fries and choosing fruit when available    Past Medical History:  Diagnosis Date   Anemia    Hypertension    Vaginal delivery    1996, 2004, 2009     Social History   Tobacco Use   Smoking status: Never    Passive exposure: Never   Smokeless tobacco: Never  Vaping Use   Vaping status: Never Used  Substance Use Topics   Alcohol use: Yes    Alcohol/week: 2.0 standard drinks of alcohol    Types: 2 Glasses of wine per week   Drug use: Never    Past Surgical History:  Procedure Laterality Date   BREAST BIOPSY Left 2018   TUBAL LIGATION  2009    Family History  Problem Relation Age of Onset   Hypertension Mother    Heart disease Father    Drug abuse Father     Alcohol abuse Father    Hypertension Sister    Hypertension Brother    Hypertension Maternal Grandmother    Diabetes Maternal Grandmother    Hypertension Paternal Grandmother    Diabetes Paternal Grandmother    Colon cancer Neg Hx    Esophageal cancer Neg Hx    Stomach cancer Neg Hx    Rectal cancer Neg Hx     No Known Allergies  Current Medications:   Current Outpatient Medications:    Biotin-Vitamin C (HAIR SKIN NAILS GUMMIES PO), Take 2 each by mouth daily in the afternoon., Disp: , Rfl:    olmesartan -hydrochlorothiazide (BENICAR  HCT) 40-25 MG tablet, TAKE 1 TABLET BY MOUTH EVERY DAY, Disp: 90 tablet, Rfl: 1   Probiotic Product (PROBIOTIC-10 ULTIMATE) CAPS, Take 1 capsule by mouth daily in the afternoon., Disp: , Rfl:    Review of Systems:   Negative unless otherwise specified per HPI.  Vitals:   Vitals:   03/13/24 0957 03/13/24 1026  BP: (!) 146/90 (!) 140/90  Pulse: 70   Temp: (!) 97.3 F (36.3 C)   TempSrc: Oral   SpO2: 99%   Weight: 213 lb 8 oz (96.8 kg)   Height: 5' 4 (1.626 m)      Body mass index is 36.65 kg/m.  Physical Exam:   Physical Exam Vitals and nursing note reviewed.  Constitutional:  General: She is not in acute distress.    Appearance: She is well-developed. She is not ill-appearing or toxic-appearing.  Cardiovascular:     Rate and Rhythm: Normal rate and regular rhythm.     Pulses: Normal pulses.     Heart sounds: Normal heart sounds, S1 normal and S2 normal.  Pulmonary:     Effort: Pulmonary effort is normal.     Breath sounds: Normal breath sounds.  Skin:    General: Skin is warm and dry.  Neurological:     Mental Status: She is alert.     GCS: GCS eye subscore is 4. GCS verbal subscore is 5. GCS motor subscore is 6.  Psychiatric:        Speech: Speech normal.        Behavior: Behavior normal. Behavior is cooperative.    Results for orders placed or performed in visit on 03/13/24  POCT glycosylated hemoglobin (Hb A1C)   Result Value Ref Range   Hemoglobin A1C 6.4 (A) 4.0 - 5.6 %     Assessment and Plan:   Assessment and Plan Assessment & Plan Primary hypertension Blood pressure at 146/90 mmHg, above target. Home readings mostly normal. Medication adherence noted. Stress and activity changes may contribute to elevation. - Evaluated kidney function due to antihypertensive medication. - Continued current antihypertensive regimen of olmesartan -hydrochloroTHIAZIDE 40-25 - Encouraged home blood pressure monitoring.  Insulin resistance -A1c well controlled with slight improvement -Offered medication, rybelsus - she declined -encouraged more regular walking    Lucie Buttner, PA-C

## 2024-03-14 ENCOUNTER — Ambulatory Visit: Payer: Self-pay | Admitting: Physician Assistant

## 2024-03-19 DIAGNOSIS — Z1231 Encounter for screening mammogram for malignant neoplasm of breast: Secondary | ICD-10-CM | POA: Diagnosis not present

## 2024-03-19 DIAGNOSIS — R92333 Mammographic heterogeneous density, bilateral breasts: Secondary | ICD-10-CM | POA: Diagnosis not present

## 2024-03-19 LAB — HM MAMMOGRAPHY

## 2024-04-21 ENCOUNTER — Ambulatory Visit: Admitting: Physician Assistant

## 2024-05-06 DIAGNOSIS — H40013 Open angle with borderline findings, low risk, bilateral: Secondary | ICD-10-CM | POA: Diagnosis not present

## 2024-06-04 ENCOUNTER — Ambulatory Visit: Admitting: Physician Assistant

## 2024-06-04 ENCOUNTER — Encounter: Payer: Self-pay | Admitting: Physician Assistant

## 2024-06-04 VITALS — BP 114/68 | HR 100 | Temp 97.4°F | Ht 64.0 in | Wt 218.4 lb

## 2024-06-04 DIAGNOSIS — J0100 Acute maxillary sinusitis, unspecified: Secondary | ICD-10-CM | POA: Diagnosis not present

## 2024-06-04 DIAGNOSIS — J309 Allergic rhinitis, unspecified: Secondary | ICD-10-CM

## 2024-06-04 MED ORDER — FLUTICASONE PROPIONATE 50 MCG/ACT NA SUSP: 2.0000 | Freq: Every day | NASAL | 6 refills | Status: AC

## 2024-06-04 MED ORDER — AMOXICILLIN-POT CLAVULANATE 875-125 MG PO TABS: 1.0000 | ORAL_TABLET | Freq: Two times a day (BID) | ORAL | 0 refills | Status: AC

## 2024-06-04 NOTE — Progress Notes (Unsigned)
 [name] is a 53 y.o. female here for a new problem.  History of Present Illness:   Chief Complaint  Patient presents with   Sinus Problem    Pt c/o sinus headache off and on x 2 months, nasal drainage bloody, coughing and expectorating yellow sputum.    Discussed the use of AI scribe software for clinical note transcription with the patient, who gave verbal consent to proceed.  History of Present Illness   [name] is a 53 year old female who presents with persistent sinus symptoms and nasal congestion.  Sinus symptoms have persisted for approximately two months. Over-the-counter allergy medications provide only temporary relief. Symptoms include sneezing, nasal congestion, and pain when sneezing. Scabs are present inside the nose, managed with Vaseline application.  Postnasal drip causes throat itchiness and leads to coughing. Congestion causes difficulty sleeping, requiring her to keep her head elevated. She feels dehydrated despite increased water intake.  Ear popping and pressure are bothersome. She wakes up with headaches, though the exact location is unspecified. Yellow phlegm is present when coughing. Home remedies and over-the-counter medications have not provided lasting relief.       Past Medical History:  Diagnosis Date   Anemia    Hypertension    Vaginal delivery    1996, 2004, 2009     Social History   Tobacco Use   Smoking status: Never    Passive exposure: Never   Smokeless tobacco: Never  Vaping Use   Vaping status: Never Used  Substance Use Topics   Alcohol use: Yes    Alcohol/week: 2.0 standard drinks of alcohol    Types: 2 Glasses of wine per week   Drug use: Never    Past Surgical History:  Procedure Laterality Date   BREAST BIOPSY Left 2018   TUBAL LIGATION  2009    Family History  Problem Relation Age of Onset   Hypertension Mother    Heart disease Father    Drug abuse Father    Alcohol abuse Father    Hypertension Sister     Hypertension Brother    Hypertension Maternal Grandmother    Diabetes Maternal Grandmother    Hypertension Paternal Grandmother    Diabetes Paternal Grandmother    Colon cancer Neg Hx    Esophageal cancer Neg Hx    Stomach cancer Neg Hx    Rectal cancer Neg Hx     No Known Allergies  Current Medications:   Current Outpatient Medications:    Biotin-Vitamin C (HAIR SKIN NAILS GUMMIES PO), Take 2 each by mouth daily in the afternoon., Disp: , Rfl:    olmesartan -hydrochlorothiazide (BENICAR  HCT) 40-25 MG tablet, TAKE 1 TABLET BY MOUTH EVERY DAY, Disp: 90 tablet, Rfl: 1   Probiotic Product (PROBIOTIC-10 ULTIMATE) CAPS, Take 1 capsule by mouth daily in the afternoon., Disp: , Rfl:    Review of Systems:   Negative unless otherwise specified per HPI.  Vitals:   Vitals:   06/04/24 1531  BP: 114/68  Pulse: 100  Temp: (!) 97.4 F (36.3 C)  TempSrc: Temporal  SpO2: 99%  Weight: 218 lb 6.1 oz (99.1 kg)  Height: 5' 4 (1.626 m)     Body mass index is 37.48 kg/m.  Physical Exam:   Physical Exam Vitals and nursing note reviewed.  Constitutional:      General: She is not in acute distress.    Appearance: She is well-developed. She is not ill-appearing or toxic-appearing.  Cardiovascular:  Rate and Rhythm: Normal rate and regular rhythm.     Pulses: Normal pulses.     Heart sounds: Normal heart sounds, S1 normal and S2 normal.  Pulmonary:     Effort: Pulmonary effort is normal.     Breath sounds: Normal breath sounds.  Skin:    General: Skin is warm and dry.  Neurological:     Mental Status: She is alert.     GCS: GCS eye subscore is 4. GCS verbal subscore is 5. GCS motor subscore is 6.  Psychiatric:        Speech: Speech normal.        Behavior: Behavior normal. Behavior is cooperative.     Assessment and Plan:   Assessment and Plan    Acute sinusitis with allergic rhinitis Persistent nasal congestion, postnasal drip, sneezing, and sinus headaches likely  exacerbated by dry nasal passages and possibly related to antihypertensive medication. - Prescribed antibiotic for sinus infection. - Recommended daily antihistamine such as Claritin, Allegra, or Zyrtec. - Advised use of nasal spray. - Continue using Vaseline for nasal moisture. - Encouraged increased water intake. - Consider ENT referral if symptoms persist.    Lucie Buttner, PA-C

## 2024-06-04 NOTE — Patient Instructions (Signed)
 It was great to see you!  Start Augmentin for sinus infection  Start regular use of over the counter antihistamines such as Zyrtec (cetirizine), Claritin (loratadine), Allegra (fexofenadine), or Xyzal (levocetirizine) daily as needed.  Start Flonase 1 spray twice a day.  Let's follow-up in 3 months for check in on health issues, sooner if you have concerns.  Take care,  Lucie Buttner PA-C

## 2024-06-29 ENCOUNTER — Other Ambulatory Visit: Payer: Self-pay | Admitting: Physician Assistant
